# Patient Record
Sex: Male | Born: 1955
Health system: Southern US, Community
[De-identification: ages and names within clinical notes are randomized; demographics above are authoritative.]

## PROBLEM LIST (undated history)

## (undated) DIAGNOSIS — I639 Cerebral infarction, unspecified: Secondary | ICD-10-CM

## (undated) DIAGNOSIS — F32A Depression, unspecified: Secondary | ICD-10-CM

## (undated) DIAGNOSIS — M1990 Unspecified osteoarthritis, unspecified site: Secondary | ICD-10-CM

## (undated) DIAGNOSIS — R911 Solitary pulmonary nodule: Secondary | ICD-10-CM

## (undated) DIAGNOSIS — Z66 Do not resuscitate: Secondary | ICD-10-CM

## (undated) DIAGNOSIS — M4802 Spinal stenosis, cervical region: Secondary | ICD-10-CM

## (undated) DIAGNOSIS — E785 Hyperlipidemia, unspecified: Secondary | ICD-10-CM

## (undated) DIAGNOSIS — C801 Malignant (primary) neoplasm, unspecified: Secondary | ICD-10-CM

## (undated) DIAGNOSIS — E871 Hypo-osmolality and hyponatremia: Secondary | ICD-10-CM

## (undated) DIAGNOSIS — R001 Bradycardia, unspecified: Secondary | ICD-10-CM

## (undated) DIAGNOSIS — D696 Thrombocytopenia, unspecified: Secondary | ICD-10-CM

## (undated) DIAGNOSIS — Z720 Tobacco use: Secondary | ICD-10-CM

## (undated) DIAGNOSIS — G459 Transient cerebral ischemic attack, unspecified: Secondary | ICD-10-CM

## (undated) DIAGNOSIS — I10 Essential (primary) hypertension: Secondary | ICD-10-CM

## (undated) DIAGNOSIS — F419 Anxiety disorder, unspecified: Secondary | ICD-10-CM

## (undated) DIAGNOSIS — K219 Gastro-esophageal reflux disease without esophagitis: Secondary | ICD-10-CM

## (undated) HISTORY — PX: VARICOSE VEIN SURGERY: SHX832

## (undated) HISTORY — DX: Malignant (primary) neoplasm, unspecified: C80.1

## (undated) HISTORY — DX: Bradycardia, unspecified: R00.1

## (undated) HISTORY — PX: HERNIA REPAIR: SHX51

## (undated) HISTORY — DX: Cerebral infarction, unspecified: I63.9

## (undated) HISTORY — DX: Tobacco use: Z72.0

## (undated) HISTORY — DX: Transient cerebral ischemic attack, unspecified: G45.9

## (undated) HISTORY — DX: Essential (primary) hypertension: I10

## (undated) HISTORY — PX: FINGER SURGERY: SHX640

## (undated) HISTORY — DX: Hyperlipidemia, unspecified: E78.5

---

## 2014-07-13 ENCOUNTER — Emergency Department: Payer: Self-pay | Admitting: Emergency Medicine

## 2014-07-13 LAB — URINALYSIS, COMPLETE
BILIRUBIN, UR: NEGATIVE
Bacteria: NONE SEEN
Blood: NEGATIVE
Glucose,UR: NEGATIVE mg/dL (ref 0–75)
KETONE: NEGATIVE
LEUKOCYTE ESTERASE: NEGATIVE
NITRITE: NEGATIVE
Ph: 8 (ref 4.5–8.0)
Protein: NEGATIVE
SQUAMOUS EPITHELIAL: NONE SEEN
Specific Gravity: 1.019 (ref 1.003–1.030)
WBC UR: NONE SEEN /HPF (ref 0–5)

## 2014-07-13 LAB — COMPREHENSIVE METABOLIC PANEL
ALBUMIN: 3.5 g/dL (ref 3.4–5.0)
ALK PHOS: 70 U/L
Anion Gap: 8 (ref 7–16)
BUN: 17 mg/dL (ref 7–18)
Bilirubin,Total: 0.2 mg/dL (ref 0.2–1.0)
CALCIUM: 8.5 mg/dL (ref 8.5–10.1)
CHLORIDE: 112 mmol/L — AB (ref 98–107)
CO2: 26 mmol/L (ref 21–32)
CREATININE: 1.12 mg/dL (ref 0.60–1.30)
EGFR (African American): >60
Glucose: 95 mg/dL (ref 65–99)
OSMOLALITY: 292 (ref 275–301)
Potassium: 4 mmol/L (ref 3.5–5.1)
SGOT(AST): 26 U/L (ref 15–37)
SGPT (ALT): 20 U/L
Sodium: 146 mmol/L — ABNORMAL HIGH (ref 136–145)
Total Protein: 7.1 g/dL (ref 6.4–8.2)

## 2014-07-13 LAB — CBC
HCT: 49.2 % (ref 40.0–52.0)
HGB: 16.6 g/dL (ref 13.0–18.0)
MCH: 30.8 pg (ref 26.0–34.0)
MCHC: 33.7 g/dL (ref 32.0–36.0)
MCV: 91 fL (ref 80–100)
Platelet: 110 10*3/uL — ABNORMAL LOW (ref 150–440)
RBC: 5.39 10*6/uL (ref 4.40–5.90)
RDW: 13.7 % (ref 11.5–14.5)
WBC: 7.4 10*3/uL (ref 3.8–10.6)

## 2014-07-13 LAB — LIPASE, BLOOD: LIPASE: 100 U/L (ref 73–393)

## 2014-07-13 LAB — TROPONIN I

## 2014-09-12 ENCOUNTER — Emergency Department: Payer: Self-pay | Admitting: Emergency Medicine

## 2014-09-12 LAB — CBC
HCT: 46.4 % (ref 40.0–52.0)
HGB: 15.4 g/dL (ref 13.0–18.0)
MCH: 30.6 pg (ref 26.0–34.0)
MCHC: 33.3 g/dL (ref 32.0–36.0)
MCV: 92 fL (ref 80–100)
Platelet: 115 10*3/uL — ABNORMAL LOW (ref 150–440)
RBC: 5.05 10*6/uL (ref 4.40–5.90)
RDW: 13.8 % (ref 11.5–14.5)
WBC: 7.1 10*3/uL (ref 3.8–10.6)

## 2014-09-12 LAB — COMPREHENSIVE METABOLIC PANEL
ALBUMIN: 3.4 g/dL (ref 3.4–5.0)
ALT: 18 U/L
ANION GAP: 7 (ref 7–16)
AST: 17 U/L (ref 15–37)
Alkaline Phosphatase: 85 U/L
BUN: 21 mg/dL — AB (ref 7–18)
Bilirubin,Total: 0.2 mg/dL (ref 0.2–1.0)
CALCIUM: 8.2 mg/dL — AB (ref 8.5–10.1)
Chloride: 114 mmol/L — ABNORMAL HIGH (ref 98–107)
Co2: 28 mmol/L (ref 21–32)
Creatinine: 0.87 mg/dL (ref 0.60–1.30)
EGFR (African American): >60
Glucose: 83 mg/dL (ref 65–99)
OSMOLALITY: 298 (ref 275–301)
POTASSIUM: 3.9 mmol/L (ref 3.5–5.1)
Sodium: 149 mmol/L — ABNORMAL HIGH (ref 136–145)
Total Protein: 6.7 g/dL (ref 6.4–8.2)

## 2014-09-12 LAB — LIPASE, BLOOD: LIPASE: 112 U/L (ref 73–393)

## 2014-09-12 LAB — URINALYSIS, COMPLETE
Bacteria: NONE SEEN
Bilirubin,UR: NEGATIVE
Blood: NEGATIVE
GLUCOSE, UR: NEGATIVE mg/dL (ref 0–75)
Ketone: NEGATIVE
LEUKOCYTE ESTERASE: NEGATIVE
NITRITE: NEGATIVE
PH: 5 (ref 4.5–8.0)
PROTEIN: NEGATIVE
RBC,UR: 1 /HPF (ref 0–5)
Specific Gravity: 1.028 (ref 1.003–1.030)
WBC UR: 1 /HPF (ref 0–5)

## 2015-02-15 ENCOUNTER — Inpatient Hospital Stay: Payer: Self-pay | Admitting: Internal Medicine

## 2015-02-15 DIAGNOSIS — Z720 Tobacco use: Secondary | ICD-10-CM

## 2015-02-15 DIAGNOSIS — I639 Cerebral infarction, unspecified: Secondary | ICD-10-CM

## 2015-02-15 DIAGNOSIS — I495 Sick sinus syndrome: Secondary | ICD-10-CM

## 2015-02-15 DIAGNOSIS — G459 Transient cerebral ischemic attack, unspecified: Secondary | ICD-10-CM

## 2015-02-15 DIAGNOSIS — I10 Essential (primary) hypertension: Secondary | ICD-10-CM

## 2015-02-17 DIAGNOSIS — R001 Bradycardia, unspecified: Secondary | ICD-10-CM

## 2015-02-22 ENCOUNTER — Telehealth: Payer: Self-pay

## 2015-02-22 NOTE — Telephone Encounter (Signed)
Attempted to contact pt regarding discharge from [facility] on 02/16/15. Left message asking pt to call back w/ any questions or concerns regarding her discharge instructions and/or medications.  Advised her of appt w/ Dr. Rockey Situ 03/05/15 @ 9:45. Asked her to call back if she is unable to keep this appt.

## 2015-03-05 ENCOUNTER — Encounter: Payer: Self-pay | Admitting: Cardiovascular Disease

## 2015-03-05 ENCOUNTER — Ambulatory Visit (INDEPENDENT_AMBULATORY_CARE_PROVIDER_SITE_OTHER): Payer: Self-pay | Admitting: Cardiovascular Disease

## 2015-03-05 VITALS — BP 142/88 | HR 48 | Ht 72.0 in | Wt 209.5 lb

## 2015-03-05 DIAGNOSIS — G458 Other transient cerebral ischemic attacks and related syndromes: Secondary | ICD-10-CM | POA: Insufficient documentation

## 2015-03-05 DIAGNOSIS — R001 Bradycardia, unspecified: Secondary | ICD-10-CM

## 2015-03-05 DIAGNOSIS — R531 Weakness: Secondary | ICD-10-CM | POA: Insufficient documentation

## 2015-03-05 DIAGNOSIS — R5383 Other fatigue: Secondary | ICD-10-CM

## 2015-03-05 DIAGNOSIS — Z638 Other specified problems related to primary support group: Secondary | ICD-10-CM

## 2015-03-05 DIAGNOSIS — F439 Reaction to severe stress, unspecified: Secondary | ICD-10-CM

## 2015-03-05 NOTE — Assessment & Plan Note (Signed)
TIA-type symptoms. Other atypical symptoms since his discharge. Recommended he continue on aspirin daily Unclear if he needs to stay on the statin given minimal carotid disease, cholesterol 170

## 2015-03-05 NOTE — Patient Instructions (Signed)
You are doing well. No medication changes were made.  We will monitor your monitor and will call you for abnormal rhythm  Please call us if you have new issues that need to be addressed before your next appt.

## 2015-03-05 NOTE — Assessment & Plan Note (Signed)
Stress as mentioned above, taking care of elderly parent. Currently unemployed

## 2015-03-05 NOTE — Assessment & Plan Note (Signed)
Fatigue likely secondary to taking care of his mother at home who has dementia. Broken sleep

## 2015-03-05 NOTE — Progress Notes (Signed)
Patient ID: Alejandro Wall, male    DOB: 03/03/1956, 59 y.o.   MRN: [id number]  HPI Comments: Mr. Alejandro Wall is a 59 year old gentleman with history of chronic sinus bradycardia, recently presenting to the hospital with TIA type symptoms, right-sided weakness, slurred speech. Date of admission 02/15/2015. He was seen by cardiology for bradycardia. This was felt to be a chronic issue, unrelated to his presentation. He is a smoker, smoked from age [age] years He reports having progressive shortness of breath over the past 2 years with exertion  He had MRI of the brain, echocardiogram, carotid duplex all of which were normal He was started on aspirin, statin and discharged home. He was recommended that he have a 30 day event monitor to rule out high-grade heart block  In follow-up today, he reports that he has been doing well. He takes care of his elderly mother. He is looking for a job, having difficulty managing her at home. He has broken sleep, feels tired most of the time. He does report having 2-3 seconds of right leg weakness since his discharge. This resolved without intervention  Lab work in the hospital showed total cholesterol 176, LDL 116, HDL 37  Monitor was reviewed with him which shows normal sinus rhythm with heart rate ranging from 50-60, rarely down to 40.  EKG on today's visit shows sinus bradycardia with rate 46 bpm, nonspecific T wave abnormality   No Known Allergies  Outpatient Encounter Prescriptions as of 03/05/2015  Medication Sig  . aspirin 81 MG tablet Take 81 mg by mouth daily.  . ciprofloxacin (CIPRO) 500 MG tablet Take 500 mg by mouth 2 (two) times daily.  . metroNIDAZOLE (FLAGYL) 500 MG tablet Take 500 mg by mouth every 12 (twelve) hours.  . ondansetron (ZOFRAN) 4 MG tablet Take 4 mg by mouth 3 (three) times daily as needed for nausea or vomiting.  . simvastatin (ZOCOR) 40 MG tablet Take 40 mg by mouth daily.    Past Medical History  Diagnosis Date  .  Hyperlipidemia   . TIA (transient ischemic attack)   . Stroke   . Hypertension   . Tobacco abuse   . Bradycardia     Past Surgical History  Procedure Laterality Date  . Hernia repair      x 2  . Finger surgery    . Varicose vein surgery      Social History  reports that he has been smoking Cigarettes.  He has a 20 pack-year smoking history. He does not have any smokeless tobacco history on file. He reports that he does not drink alcohol or use illicit drugs.  Family History family history includes Heart failure in his mother.   Review of Systems  Constitutional: Positive for fatigue.  Respiratory: Negative.   Cardiovascular: Negative.   Gastrointestinal: Negative.   Musculoskeletal: Negative.   Skin: Negative.   Neurological: Negative.        Leg weakness, resolved  Hematological: Negative.   Psychiatric/Behavioral: Negative.   All other systems reviewed and are negative.   BP 142/88 mmHg  Pulse 48  Ht 6' (1.829 m)  Wt 209 lb 8 oz (95.029 kg)  BMI 28.41 kg/m2  Physical Exam  Constitutional: He is oriented to person, place, and time. He appears well-developed and well-nourished.  HENT:  Head: Normocephalic.  Nose: Nose normal.  Mouth/Throat: Oropharynx is clear and moist.  Eyes: Conjunctivae are normal. Pupils are equal, round, and reactive to light.  Neck: Normal range of motion. Neck supple.  No JVD present.  Cardiovascular: Normal rate, regular rhythm, S1 normal, S2 normal, normal heart sounds and intact distal pulses.  Exam reveals no gallop and no friction rub.   No murmur heard. Pulmonary/Chest: Effort normal and breath sounds normal. No respiratory distress. He has no wheezes. He has no rales. He exhibits no tenderness.  Abdominal: Soft. Bowel sounds are normal. He exhibits no distension. There is no tenderness.  Musculoskeletal: Normal range of motion. He exhibits no edema or tenderness.  Lymphadenopathy:    He has no cervical adenopathy.  Neurological:  He is alert and oriented to person, place, and time. Coordination normal.  Skin: Skin is warm and dry. No rash noted. No erythema.  Psychiatric: He has a normal mood and affect. His behavior is normal. Judgment and thought content normal.      Assessment and Plan   Nursing note and vitals reviewed.

## 2015-03-05 NOTE — Assessment & Plan Note (Signed)
Asymptomatic bradycardia. No further workup at this time. He currently has a 30 day monitor in place. No other pauses, high degree block noted

## 2015-03-25 DIAGNOSIS — M1990 Unspecified osteoarthritis, unspecified site: Secondary | ICD-10-CM | POA: Insufficient documentation

## 2015-03-25 LAB — POCT ERYTHROCYTE SEDIMENTATION RATE, NON-AUTOMATED: Sed Rate: 4 mm

## 2015-04-04 NOTE — Discharge Summary (Signed)
PATIENT NAME:  Alejandro Wall, Alejandro Wall MR#:  [id number] DATE OF BIRTH:  04-14-1956  DATE OF ADMISSION:  02/15/2015 DATE OF DISCHARGE:  02/16/2015  PRIMARY CARE PHYSICIAN:  None local.  DISCHARGE DIAGNOSES: 1. Transient ischemic attack.  2. Sinus bradycardia. 3. Hypertension.  4. Hyperlipidemia.  5. Tobacco abuse.   CONDITION: Stable.   CODE STATUS: Full code.   HOME MEDICATIONS: Aspirin 81 mg p.o. daily, Zocor 40 mg p.o. at bedtime.   DIET: Low sodium, low fat, low cholesterol diet.   ACTIVITY: As tolerated.   FOLLOW-UP CARE:  With PCP and Dr. Rockey Situ and within 1-2 weeks.   REASON FOR ADMISSION: Weakness on the right side and slurred speech.   HOSPITAL COURSE: The patient is a 59 year old Caucasian male with no past medical history, came to ED due to right side weakness and slurred speech. For detailed history and physical examinations, please refer to the admission note dictated by Dr. Volanda Napoleon.  The patient's CT scan of head did not show any intracranial abnormalities. Chest x-ray did not show any acute abnormality. EKG shows sinus bradycardia at 40. The patient's labs were unremarkable.   1. Transient ischemic attack. The patient's symptoms resolved after admission.  The patient got an MRI of brain which was negative for CVA. Echocardiograph was normal.  A carotid duplex did not show any stenosis.  The patient has been treated with aspirin and statin. The patient has no complaints.  2. Sinus bradycardia with Dr. Rockey Situ suggested the patient has sinus bradycardia, etiology is not clear but I did not see any evidence of high-grade heart block. The patient needs to follow up with him as outpatient for 30 day event monitor.    The patient has no complaints. Vital signs are stable. Physical  examination is unremarkable. He is clinically stable and will be discharged to home today. I discussed the patient's discharge plan with the patient, nurse, case manager, and Dr. Rockey Situ.  In addition, the patient  was counseled for smoking cessation.   TIME SPENT:  About 43 minutes.    ____________________________ Demetrios Loll, MD qc:tr D: 02/16/2015 16:50:59 ET T: 02/16/2015 18:00:05 ET JOB#: [id number]  cc: Demetrios Loll, MD, <Dictator> Demetrios Loll MD ELECTRONICALLY SIGNED 02/17/2015 17:17

## 2015-04-04 NOTE — H&P (Signed)
PATIENT NAME:  Alejandro Wall, Alejandro Wall MR#:  [id number] DATE OF BIRTH:  12/31/1955  DATE OF ADMISSION:  02/15/2015   PRIMARY CARE PHYSICIAN: None.   REFERRING EMERGENCY ROOM PHYSICIAN: Lavonia Drafts, MD   CHIEF COMPLAINT: Weakness on the right side and slurred speech.   HISTORY OF PRESENT ILLNESS: This very pleasant 59 year old man with no significant past medical history presents to the Emergency Room with approximately 24 hours of intermittant weakness on the right side and slurred speech. He reports that he was in his normal state of health until yesterday at about 1:30 p.m. when he was walking around his backyard.  He began to feel very dizzy and weak on the right side, lost his balance and had to sit down.  Symptoms improved but recurred at about 3:00 p.m. and then improved again.  He then went to bed as usual in the evening.  At 1:00 a.m. he woke up to urinate and found that he was unable to walk to the bathroom due to weakness. He went back to sleep and at 7:00 a.m. he awoke with right-sided weakness, and slurred speech.  On presentation to the Emergency Room, his weakness has improved and his speech is clear. His heart rate is noted to be in the 30s to 40s.  Hospitalist service is asked to admit for further evaluation and treatment of potential CVA and bradycardia.   PAST MEDICAL HISTORY: No significant past medical history.   PAST SURGICAL HISTORY:  Hernia repair.   ALLERGIES: No known allergies.   HOME MEDICATIONS:  No home medications.   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  He is a current 1/2 pack per day smoker.  He does not drink alcohol or use any illicit substances. He is currently unemployed.   FAMILY MEDICAL HISTORY: Positive for coronary artery disease in both parents, stroke in his mother, diabetes in his mother. No family history of cancers.   REVIEW OF SYSTEMS:  CONSTITUTIONAL: No fever, pain, or change in weight.  HEENT: No change in  vision or hearing. No pain in eyes or ears. No sore throat, sinus congestion or difficulty swallowing.  RESPIRATORY: No cough, wheezing, shortness of breath or painful respirations. No history of COPD.   CARDIOVASCULAR: No chest pain, orthopnea, edema, no palpitations, no syncope or presyncope.  GASTROINTESTINAL: No nausea, vomiting, diarrhea, abdominal pain, or change in bowel habits.  GENITOURINARY: No dysuria or frequency.  SKIN: No new rashes or lesions.  HEMATOLOGIC: No easy bruising or bleeding. No swollen glands.  MUSCULOSKELETAL: Positive as above for right-sided weakness.  No new pain in the neck, back, shoulders, knees, or hips. No arthritis or gout.  NEUROLOGIC: Positive as above for right-sided weakness, slurred speech. No prior history of CVA, seizure, memory loss, or confusion, no headache.  PSYCHIATRIC: No bipolar disorder or schizophrenia.   PHYSICAL EXAMINATION:  VITAL SIGNS: Temperature 98.4, pulse 44, respirations 16, blood pressure 132/102, oxygenation 98% on room air.  GENERAL: No acute distress. The patient does appear fatigued.  HEENT: Pupils equal, round, and reactive to light. Extraocular motion intact. Conjunctivae are clear. Oral mucous membranes are pink and moist. Good dentition.  Posterior oropharynx is clear of exudate, edema, or erythema. Trachea is midline. No cervical lymphadenopathy. Thyroid is nontender.  RESPIRATORY: Lungs clear to auscultation bilaterally with good air movement. No wheezes, rhonchi.  CARDIOVASCULAR: Bradycardic, regular. No murmurs, rubs, or gallops. No carotid bruit. No peripheral edema. Peripheral pulses are 2+.  ABDOMEN: Soft, nontender,  nondistended. No guarding, no rebound, no hepatosplenomegaly or mass noted.  SKIN: No unusual rashes or skin lesions. No bruises.  MUSCULOSKELETAL: No swollen tender joints. Range of motion is normal and strength is 5/5 throughout.  NEUROLOGIC: Cranial nerves II through XII are grossly intact. Strength and  sensation are intact with the exception of his right hip flexors, which are 4+/5, otherwise all muscle groups with normal strength.  PSYCHIATRIC: The patient alert and oriented x 4 with good insight into his clinical condition. No signs of uncontrolled depression or anxiety.   LABORATORY DATA: Sodium 140, potassium 3.9, chloride 109, bicarbonate 23, BUN 17, creatinine 0.82, glucose 107, calcium 8.8, total protein 6.9, albumin 4.2, bilirubin 0.6.  LFTs normal. White blood cells 7.4, hemoglobin 16.5, platelets 113,000, MCV is 91.  INR is 1.0. Urinalysis is negative for signs of infection.   IMAGING:   1.  CT scan of the head without contrast is negative for acute intracranial abnormality.  2.  Chest x-ray shows minimal enlargement of the cardiac silhouette, no acute abnormalities.  3.  EKG shows sinus bradycardia.     ASSESSMENT AND PLAN:   1.  Transient ischemic attack:  Symptoms of right-sided weakness have mostly resolved with the exception of weakness of the right hip flexors.  Speech is clear.  The rest of the neurologic exam is normal.  CT is negative. MRI is pending. We will consult physical therapy and speech therapy.  He has been given an aspirin in the Emergency Room. He will be started on a statin.  I have ordered a 2-D echocardiogram, carotid Dopplers, telemetry and have consulted neurology.  2.  Bradycardia. The patient reports no history of bradycardia:  We will check cardiac enzymes, cycling 3 times.  We will admit to the stepdown unit.  He is not on any antiarrhythmic medications.  We will check a TSH.  Dr. Corky Downs has spoken with Dr. Fletcher Anon and the patient has been seen by Pilar Jarvis, NP.  At this point the plan is to observe him in stepdown unit as he is hemodynamically stable.  3.  Ongoing tobacco abuse:  The patient counseled on smoking cessation for 5 minutes by me.  We will provide nicotine replacement if needed.  4.  Prophylaxis: Heparin for deep vein thrombosis prophylaxis.  No gastrointestinal prophylaxis at this time.   TIME SPENT ON ADMISSION:  45 minutes.     ___________________________ Earleen Newport. Volanda Napoleon, MD cpw:DT D: 02/15/2015 12:50:23 ET T: 02/15/2015 13:38:18 ET JOB#: [id number]  cc: Earleen Newport. Volanda Napoleon, MD, <Dictator> Aldean Jewett MD ELECTRONICALLY SIGNED 02/16/2015 15:02

## 2015-04-04 NOTE — Consult Note (Signed)
General Aspect PCP:  None Primary Cardiologist: New _____________  Pt Profile 59 y/o male w/o prior cardiac hx who presented to the ED today 2/2 right sided wkns, slurred speech, and unsteady gait, and has been found to be bradycardic. _____________   Past Medical Hx 1.  HTN      a. Previously on meds but taken off @ some point. 2.  IBS 3.  Tobacco Abuse      a. 1/4-1/3 ppd x 19 yrs (ongoing) 4.  Umbilical Hernia s/p repair @ age 67. 63.  Left Inguinal hernia s/p repair  2000. _____________   Present Illness 59 y/o male w/o prior cardiac history.  He lives locally with his mother.  He does not have a PCP, is on no meds, and has been smoking ~ 1/4 - 1/3 ppd of cigarettes since the age of 46.  He is unemployed and mostly takes care of his mother, who is chronically ill and in and out of the hospital.    He was in his usoh until yesterday  when he began to experience unsteady gait followed by slurring of his speech.  Though the wkns and gait disturbance persisted throughout the day, the slurring of speech eventually resolved.  This AM @ 1AM, he awoke to use the bathroom but couldn't get out of bed b/c his legs were too weak.  He eventually found the strength to get out of bed but was again very unsteady and he had to hold onto the wall to support himself and prevent himself from falling.  He ended up falling back to sleep but when he awoke later in the morning, he continued to note R>L sided wkns, unsteady gait, and return of slurred speech.  He called a neighbor who then drove him into the [facility] ED.  Here, labs are unrevealing, Head CT neg for infarct, and CXR non-acute.  He has been persistently bradycardic with rates in the high 30s to 50s. There is no evidence of high grade heart block.  He is unaware of any prior h/o bradycardia.  Despite bradycardia he is hypertensive.  He denies chest pain, sob, pnd, orthopnea, nausea, vomiting, dizziness, syncope, edema, or early  satiety.  _____________  Family Hx Mother is alive @ 40.  She has a h/o CAD/CHF. Father died @ 64 with COPD/Black Lung. _____________  Social Hx Pt lives with his mother in [place]. He previously worked for [employer] but is currently unemployed.  He smokes 1/4 - 1/3 ppd of cigarettes.  He does not use drugs or alcohol.  He does not routinely exercise.  Meds: currently not on any outpt medications   Physical Exam:  GEN well developed, well nourished, no acute distress   HEENT hearing intact to voice, moist oral mucosa   NECK supple  no bruits, jvd.   RESP normal resp effort  clear BS   CARD Regular rate and rhythm  Bradycardic  Normal, S1, S2  No murmur   ABD denies tenderness  soft  normal BS   LYMPH negative neck   EXTR negative cyanosis/clubbing, negative edema   SKIN normal to palpation   NEURO cranial nerves intact, R facial droop.  Strength 2/5 RUE/RLE, 4/5 LUE, 3/5 LLE.   PSYCH alert, A+O to time, place, person, poor insight   Review of Systems:  Subjective/Chief Complaint right arm weakness   General: Fatigue  Weakness   Skin: No Complaints   ENT: No Complaints   Eyes: No Complaints   Neck:  No Complaints   Respiratory: No Complaints   Cardiovascular: No Complaints   Gastrointestinal: No Complaints   Genitourinary: No Complaints   Vascular: No Complaints   Musculoskeletal: No Complaints   Neurologic: right sided wkns, facial droop, slurred speech, unsteady gait.   Hematologic: No Complaints   Endocrine: No Complaints   Psychiatric: No Complaints   Review of Systems: All other systems were reviewed and found to be negative   Medications/Allergies Reviewed Medications/Allergies reviewed   Lab Results:  Hepatic:  14-Mar-16 10:08   Bilirubin, Total 0.6 (0.3-1.2 NOTE: New Reference Range  01/26/15)  Alkaline Phosphatase 57 (38-126 NOTE: New Reference Range  01/26/15)  SGPT (ALT) 19 (17-63 NOTE: New Reference Range   01/26/15)  SGOT (AST) 21 (15-41 NOTE: New Reference Range  01/26/15)  Total Protein, Serum 6.9 (6.5-8.1 NOTE: New Reference Range  01/26/15)  Albumin, Serum 4.2 (3.5-5.0 NOTE: New reference range  01/26/15)  Routine Chem:  14-Mar-16 10:08   Glucose, Serum  107 (65-99 NOTE: New Reference Range  01/26/15)  BUN 17 (6-20 NOTE: New Reference Range  01/26/15)  Creatinine (comp) 0.82 (0.61-1.24 NOTE: New Reference Range  01/26/15)  Sodium, Serum 140 (135-145 NOTE: New Reference Range  01/26/15)  Potassium, Serum 3.9 (3.5-5.1 NOTE: New Reference Range  01/26/15)  Chloride, Serum 109 (101-111 NOTE: New Reference Range  01/26/15)  CO2, Serum 23 (22-32 NOTE: New Reference Range  01/26/15)  Calcium (Total), Serum  8.8 (8.9-10.3 NOTE: New Reference Range  01/26/15)  eGFR (African American) >60  eGFR (Non-African American) >60 (eGFR values <99m/min/1.73 m2 may be an indication of chronic kidney disease (CKD). Calculated eGFR is useful in patients with stable renal function. The eGFR calculation will not be reliable in acutely ill patients when serum creatinine is changing rapidly. It is not useful in patients on dialysis. The eGFR calculation may not be applicable to patients at the low and high extremes of body sizes, pregnant women, and vegetarians.)  Anion Gap 8  Routine UA:  14-Mar-16 10:08   Color (UA) Yellow  Clarity (UA) Clear  Glucose (UA) Negative  Bilirubin (UA) Negative  Ketones (UA) Negative  Specific Gravity (UA) 1.019  Blood (UA) Negative  pH (UA) 6.0  Protein (UA) Negative  Nitrite (UA) Negative  Leukocyte Esterase (UA) Negative (Result(s) reported on 15 Feb 2015 at 12:03PM.)  RBC (UA) 1 /HPF  WBC (UA) <1 /HPF  Bacteria (UA) NONE SEEN  Epithelial Cells (UA) NONE SEEN  Result(s) reported on 15 Feb 2015 at 12:03PM.  Routine Coag:  14-Mar-16 10:08   Activated PTT (APTT) 26.3 (A HCT value >55% may artifactually increase the APTT. In one study, the  increase was an average of 19%. Reference: "Effect on Routine and Special Coagulation Testing Values of Citrate Anticoagulant Adjustment in Patients with High HCT Values." American Journal of Clinical Pathology 27622;633:354-562)  Prothrombin 13.7 (11.4-15.0 NOTE: New Reference Range  01/01/15)  INR 1.0 (INR reference interval applies to patients on anticoagulant therapy. A single INR therapeutic range for coumarins is not optimal for all indications; however, the suggested range for most indications is 2.0 - 3.0. Exceptions to the INR Reference Range may include: Prosthetic heart valves, acute myocardial infarction, prevention of myocardial infarction, and combinations of aspirin and anticoagulant. The need for a higher or lower target INR must be assessed individually. Reference: The Pharmacology and Management of the Vitamin K  antagonists: the seventh ACCP Conference on Antithrombotic and Thrombolytic Therapy. CBWLSL.3734Sept:126 (3suppl): [id number] A HCT value >55% may artifactually  increase the PT.  In one study,  the increase was an average of 25%. Reference:  "Effect on Routine and Special Coagulation Testing Values of Citrate Anticoagulant Adjustment in Patients with High HCT Values." American Journal of Clinical Pathology 2006;126:400-405.)  Routine Hem:  14-Mar-16 10:08   WBC (CBC) 7.4  RBC (CBC) 5.34  Hemoglobin (CBC) 16.5  Hematocrit (CBC) 48.5  Platelet Count (CBC)  113 (Result(s) reported on 15 Feb 2015 at 10:50AM.)  MCV 91  MCH 30.9  MCHC 34.1  RDW 13.7   EKG:  EKG Interp. by me   Interpretation EKG shows Sinus bradycardia, 38, non-specific t changes. No clear evidence of high grade heart block.   Additional Comments ED tele reviewed - no high grade heart block/pauses.   Radiology Results:  XRay:    14-Mar-16 10:57, Chest Portable Single View  Chest Portable Single View   REASON FOR EXAM:    CVA  COMMENTS:       PROCEDURE: DXR - DXR PORTABLE CHEST  SINGLE VIEW  - Feb 15 2015 10:57AM     CLINICAL DATA:  Began having difficulty speaking and collecting  thoughts yesterday, went away then came back this morning, history  smoking, difficulty taking deep breath, weakness, slurred speech    EXAM:  PORTABLE CHEST - 1 VIEW    COMPARISON:  Portable exam 1038 hours without priors for comparison.    FINDINGS:  Minimal enlargement of cardiac silhouette.    Mediastinal contours and pulmonary vascularity normal.    Lungs grossly clear.    No pleural effusion, pneumothorax or acute osseous findings.     IMPRESSION:  Minimal enlargement of cardiac silhouette.    No acute abnormalities.      Electronically Signed    By: Lavonia Dana M.D.    On: 02/15/2015 10:59         Verified By: Burnetta Sabin, M.D.,  [place]:    14-Mar-16 17:25, US Carotid Doppler Bilateral  US Carotid Doppler Bilateral   REASON FOR EXAM:    CVA  COMMENTS:       PROCEDURE: [place]  - US CAROTID DOPPLER BILATERAL  - Feb 15 2015  5:25PM     CLINICAL DATA:  CVA.  History of smoking.    EXAM:  BILATERAL CAROTID DUPLEX ULTRASOUND    TECHNIQUE:  Pearline Cables scale imaging, color Dopplerand duplex ultrasound were  performed of bilateral carotid and vertebral arteries in the neck.    COMPARISON:  None.  FINDINGS:  Criteria: Quantification of carotid stenosis is based on velocity  parameters that correlate the residual internal carotid diameter  with NASCET-based stenosis levels, using the diameter of the distal  internal carotid lumen as the denominator for stenosis measurement.    The following velocity measurements were obtained:    RIGHT    ICA:  71/21 cm/sec    CCA:  57/26 cm/sec    SYSTOLIC ICA/CCA RATIO:  0.8  DIASTOLIC ICA/CCA RATIO:  1.4    ECA:  57 cm/sec    LEFT    ICA:  76/28 81/13 cm/sec    CCA:  20/35 cm/sec    SYSTOLIC ICA/CCA RATIO:  0.9    DIASTOLIC ICA/CCA RATIO:  2.1    ECA:  73 cm/sec  RIGHT CAROTID ARTERY:There is no grayscale evidence  of significant  intimal thickening or atherosclerotic plaque affecting the  interrogated portions of the right carotid system. There are no  elevated peak systolic velocities within the interrogated course of  the rightinternal  carotid artery to suggest a hemodynamically  significant stenosis.    RIGHT VERTEBRAL ARTERY:  Antegrade flow    LEFT CAROTID ARTERY: There is a very minimal amount of intimal wall  thickening within the left carotid bulb (images 46 and 48),  extending to involve the origin and proximal aspect of the left  internal carotid artery (image 56), not resulting in elevated peak  systolic velocities within the interrogated course the left internal  carotid artery to suggest a hemodynamically significant stenosis.  LEFT VERTEBRAL ARTERY:  Antegrade flow     IMPRESSION:  1. Very minimal amount of left-sided intimal wall thickening and  atherosclerotic plaque, not resulting in a hemodynamically  significant stenosis.  2. Normal sonographic evaluation of the right carotid system.      Electronically Signed    By: Sandi Mariscal M.D.    On: 02/15/2015 17:30         Verified By: Aileen Fass, M.D.,  MRI:    14-Mar-16 16:14, MRI Brain Without Contrast  MRI Brain Without Contrast   REASON FOR EXAM:    CVA  COMMENTS:       PROCEDURE: MR  - MR BRAIN WO CONTRAST  - Feb 15 2015  4:14PM     CLINICAL DATA:  Right-sided weakness with slurred speech today.  Stroke    EXAM:  MRI HEAD WITHOUT CONTRAST    TECHNIQUE:  Multiplanar, multiecho pulse sequences of the brain and surrounding  structures were obtained without intravenous contrast.  COMPARISON:  CT head 02/15/2015    FINDINGS:  Ventricle size is normal. Cerebral volume is normal. Pituitary  normal in size. Craniocervical junction normal.    Negative for acute infarct.    Mild hyperintensity in the pons consistent with mild chronic  microvascular ischemia. Small hyperintensity right frontal white  matter.  Remaining cerebral white matter is normal. No cortical  infarct    Negative for hemorrhage  Negative for mass or edema.    Minimal mucosal edema in the paranasal sinuses.     IMPRESSION:  Mild chronic microvascular ischemic change.  No acute abnormality.      Electronically Signed    By: Franchot Gallo M.D.    On: 02/15/2015 16:29         Verified By: Truett Perna, M.D.,  CT:    14-Mar-16 11:00, CT Head Without Contrast  CT Head Without Contrast   REASON FOR EXAM:    CVA  COMMENTS:   May transport without cardiac monitor    PROCEDURE: CT  - CT HEAD WITHOUT CONTRAST  - Feb 15 2015 11:00AM     CLINICAL DATA:  Stroke. Right-sided weakness and slurred speech  since yesterday. Right-sided facial droop.    EXAM:  CT HEAD WITHOUT CONTRAST    TECHNIQUE:  Contiguous axial images were obtained from the base of the skull  through the vertex without intravenous contrast.  COMPARISON:  None.    FINDINGS:  The ventricles and sulci are within normal limits for age. There is  no evidence of acute infarct, intracranial hemorrhage, mass, midline  shift, or extra-axial collection. The orbits are unremarkable. The  visualized paranasal sinuses and mastoid air cells are clear. There  is no evidence of acute fracture.     IMPRESSION:  Negative.      Electronically Signed    By: Logan Bores    On: 02/15/2015 11:12         Verified By: Ferol Luz, M.D.,  No Known Allergies:   Vital Signs/Nurse's Notes:  **Vital Signs.:   14-Mar-16 17:17  Vital Signs Type returned to icu from mri and [place]  Temperature Temperature (F) 98.3  Celsius 36.8  Temperature Source oral  Pulse Pulse 38  Respirations Respirations 16  Systolic BP Systolic BP 757  Diastolic BP (mmHg) Diastolic BP (mmHg) 97  Mean BP 110  Oxygen Delivery Room Air/ 21 %    Impression 1.  Acute CVA Pt presented to the [facility] ED with a 1 day h/o unsteady gait, R>L sided wkns, and intermittent slurring of speech.    Head CT neg for infarct.   MRI with no infarct, possible TIA --Stroke w/u per IM.  2.  Sinus Bradycardia In setting of #1, pt is bradycardic but hemodynamically stable. ECG and tele extensively reviewed.  No clear evidence of high grade heart block noted. No reversible causes noted in labs. Not on any meds @ home. --In the setting of baseline HTN despite bradycardia, I do not think that bradycardia is contributing to his symptoms. --No indication for pacing @ this time. --Check echo. --Will Follow tele.  3.  HTN Says he was prev on meds but came off @ some point b/c things normalized. He does not have a PCP. --Follow pressures in setting of #1 and add antihypertensive agent if appropriate. --Avoid AVN blocking agents.    4.  Tobacco Abuse --Cessation advised.   Electronic Signatures: Rogelia Mire (NP)  (Signed 14-Mar-16 13:12)  Authored: General Aspect/Present Illness, History and Physical Exam, Review of System, Home Medications, Labs, EKG , Radiology, Allergies, Impression/Plan Ida Rogue (MD)  (Signed 14-Mar-16 19:16)  Authored: General Aspect/Present Illness, History and Physical Exam, Review of System, EKG , Radiology, Vital Signs/Nurse's Notes, Impression/Plan  Co-Signer: General Aspect/Present Illness, History and Physical Exam, Review of System, Home Medications, Labs, EKG , Radiology, Allergies, Impression/Plan   Last Updated: 14-Mar-16 19:16 by Ida Rogue (MD)

## 2015-04-20 ENCOUNTER — Telehealth: Payer: Self-pay

## 2015-04-20 NOTE — Telephone Encounter (Signed)
Reviewed results of 30 day monitor w/ pt:  "NSR w/ sinus bradycardia overnight (HR down to 39 at 5:10 am)"  Pt verbalizes understanding and will call back w/ any questions or concerns.

## 2015-04-22 ENCOUNTER — Encounter (INDEPENDENT_AMBULATORY_CARE_PROVIDER_SITE_OTHER): Payer: Self-pay

## 2015-04-22 ENCOUNTER — Other Ambulatory Visit: Payer: Self-pay

## 2015-04-22 DIAGNOSIS — R001 Bradycardia, unspecified: Secondary | ICD-10-CM

## 2016-03-25 IMAGING — CR DG CHEST 1V PORT
1 series · 1 of 1 positions shown · non-contrast
Comparison: Portable exam 2509 hours without priors for comparison.

CLINICAL DATA: Began having difficulty speaking and collecting
thoughts yesterday, went away then came back this morning, history
smoking, difficulty taking deep breath, weakness, slurred speech

EXAM:
PORTABLE CHEST - 1 VIEW

[ap]
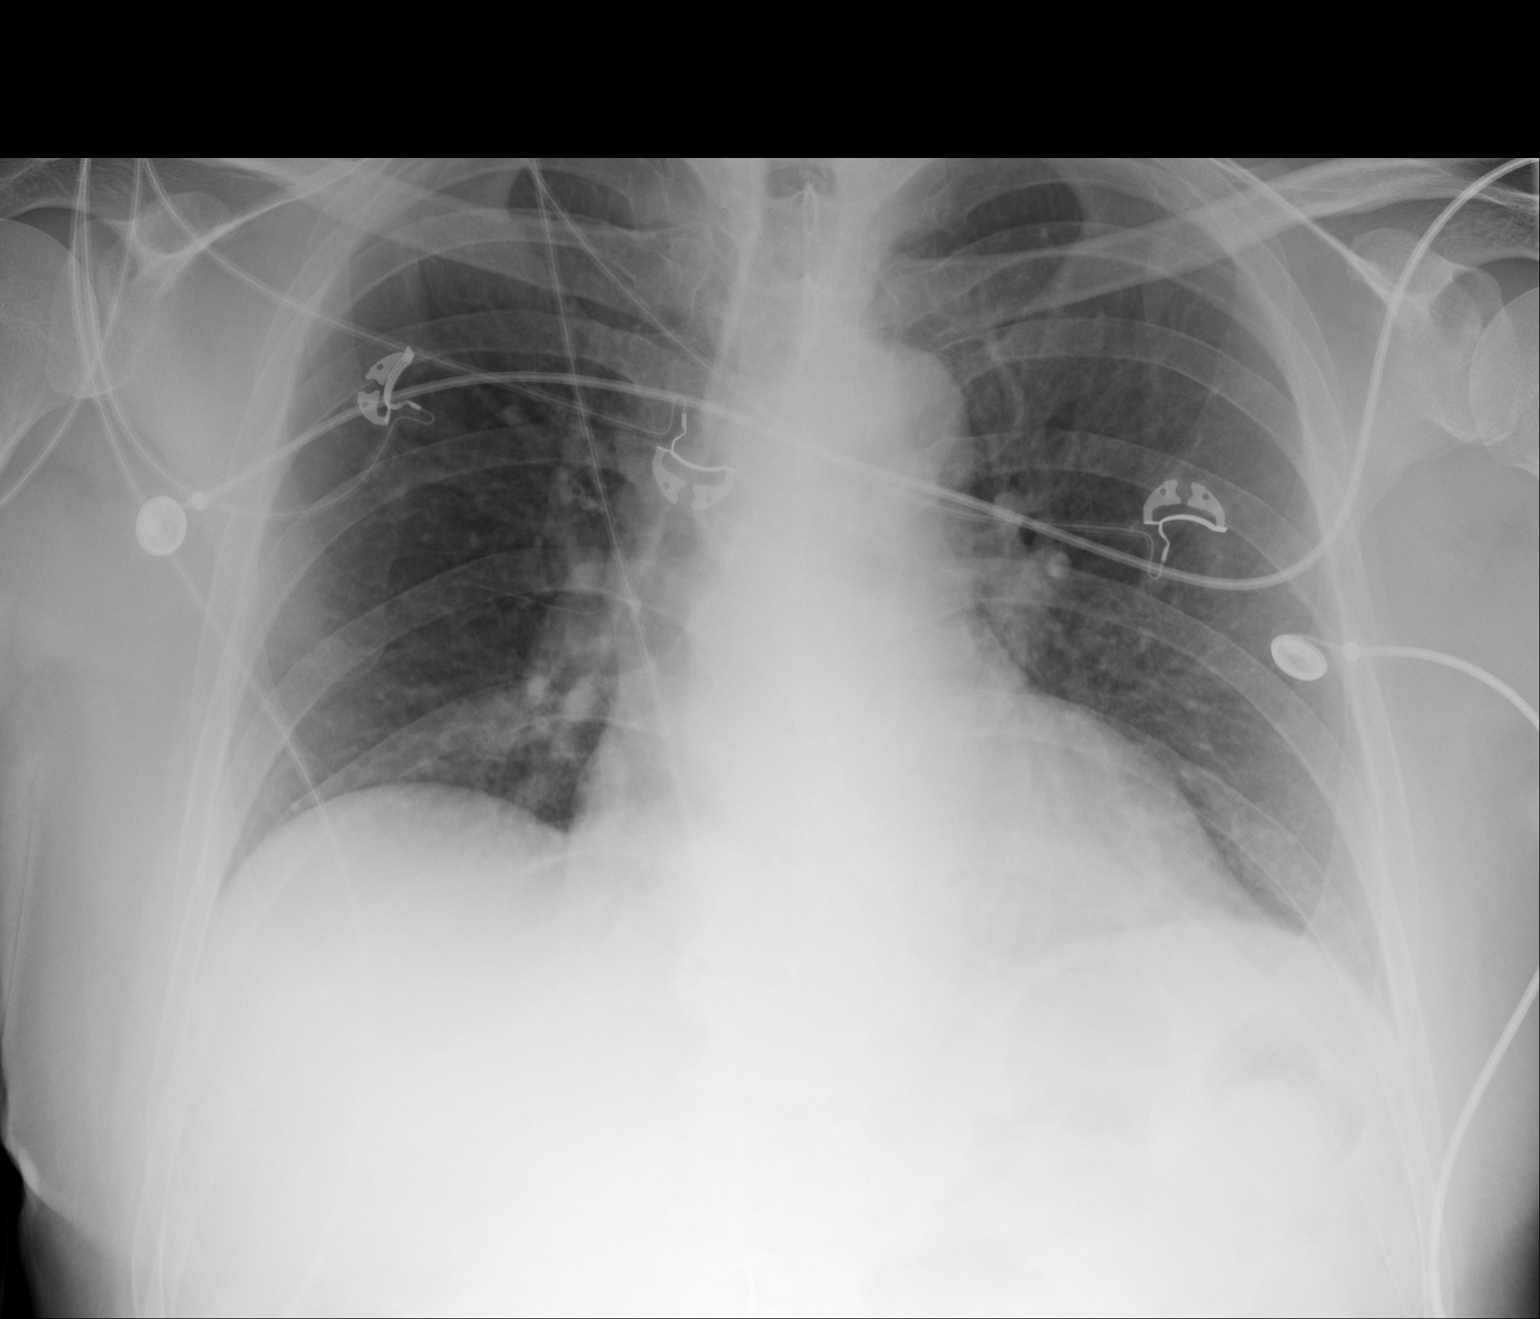

[1 of 1 positions shown; findings below may reference images not displayed]

FINDINGS: Minimal enlargement of cardiac silhouette.

Mediastinal contours and pulmonary vascularity normal.

Lungs grossly clear.

No pleural effusion, pneumothorax or acute osseous findings.
IMPRESSION: Minimal enlargement of cardiac silhouette.

No acute abnormalities.

## 2016-03-25 IMAGING — CT CT HEAD WITHOUT CONTRAST
1 series · 16 of 30 positions shown, 20 images · non-contrast
Comparison: None.

CLINICAL DATA: Stroke. Right-sided weakness and slurred speech
since yesterday. Right-sided facial droop.

EXAM:
CT HEAD WITHOUT CONTRAST
TECHNIQUE: Contiguous axial images were obtained from the base of the skull
through the vertex without intravenous contrast.

[Series 2: head wo · axial · 0.44mm/px · z∈[-134,-8]mm · 16 of 32 slices shown, 20 images]
[im 2/32  brain]
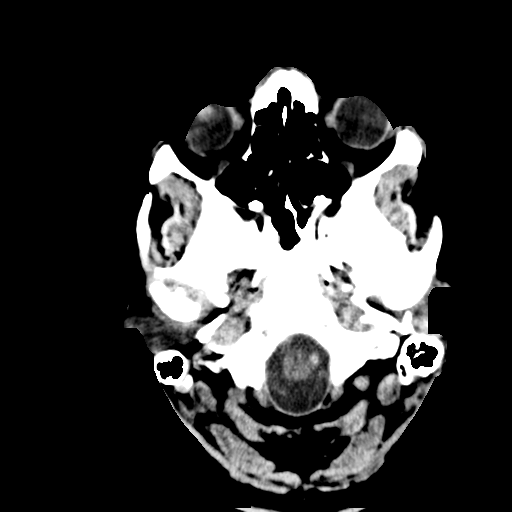
[im 2/32  bone]
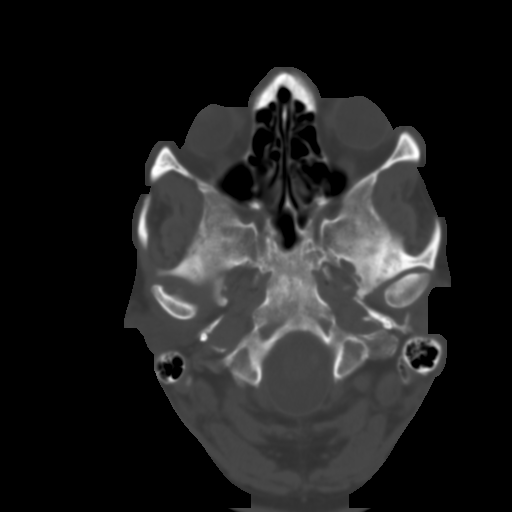
[im 4/32  brain]
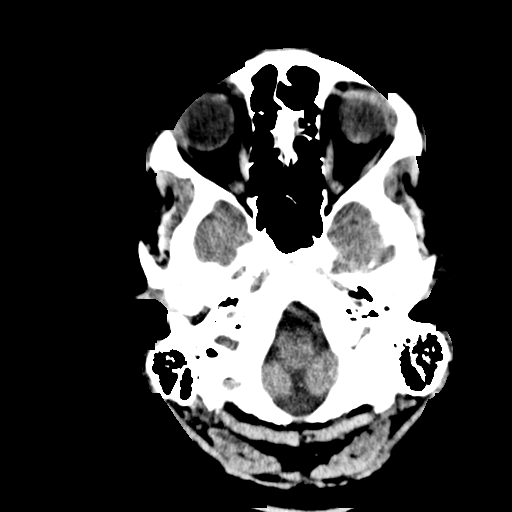
[im 6/32  brain]
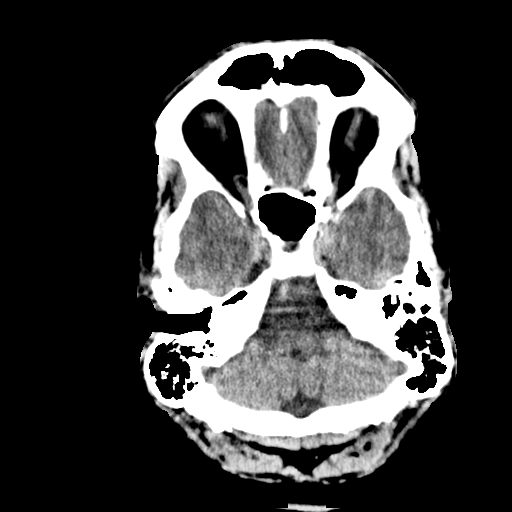
[im 8/32  brain]
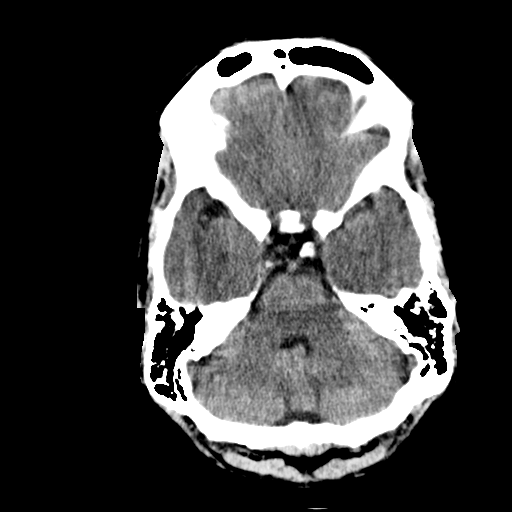
[im 9/32  brain]
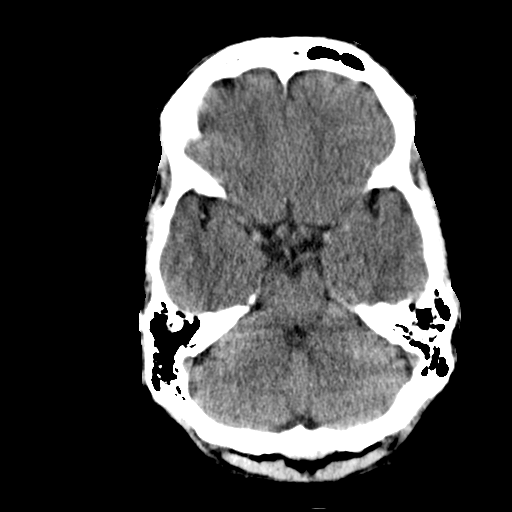
[im 9/32  bone]
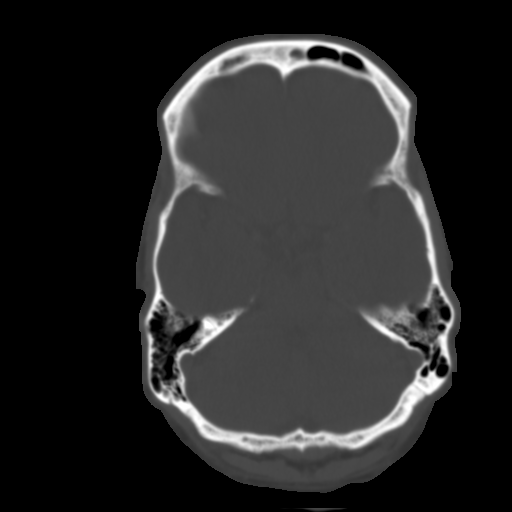
[im 11/32  brain]
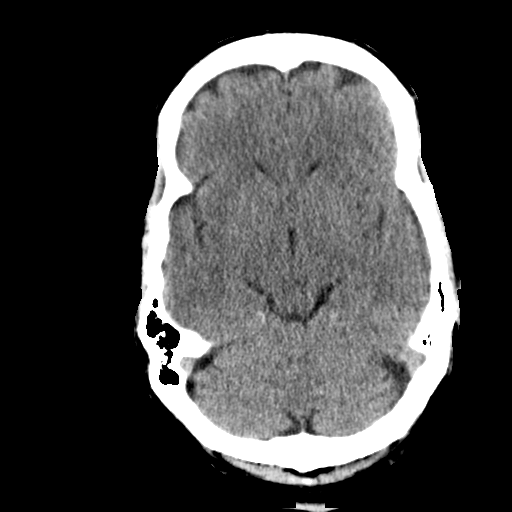
[im 13/32  brain]
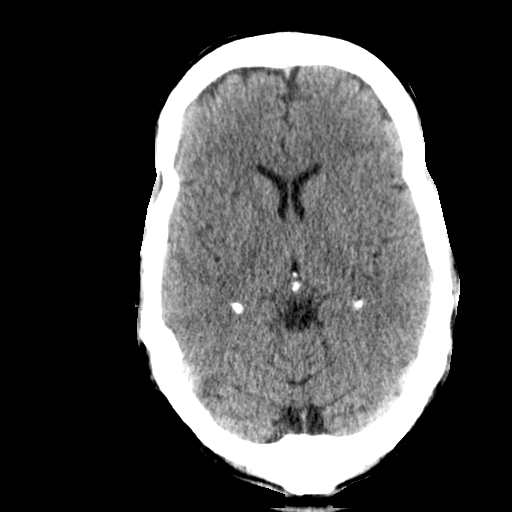
[im 15/32  brain]
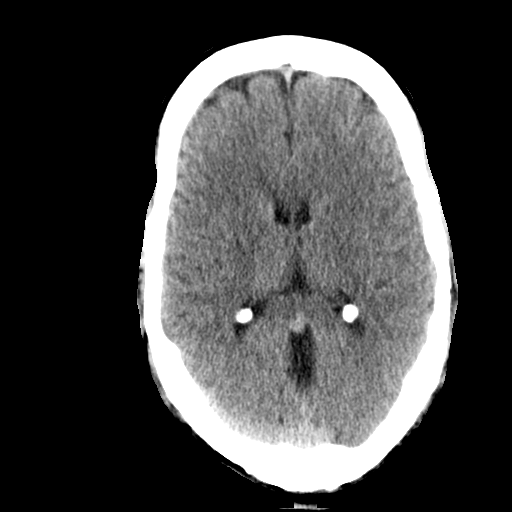
[im 17/32  brain]
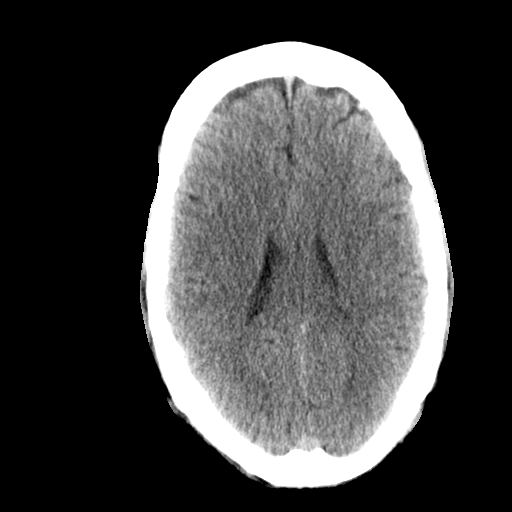
[im 17/32  bone]
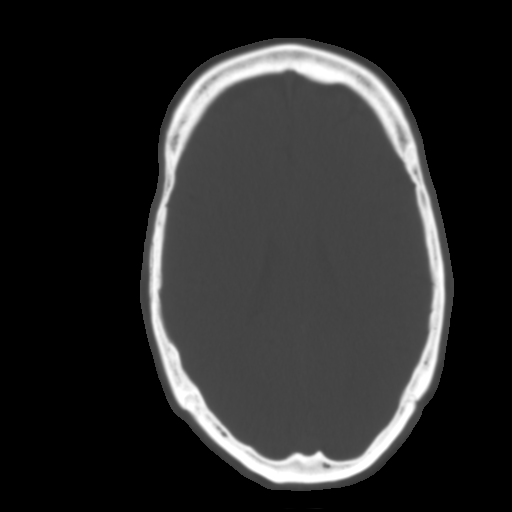
[im 19/32  brain]
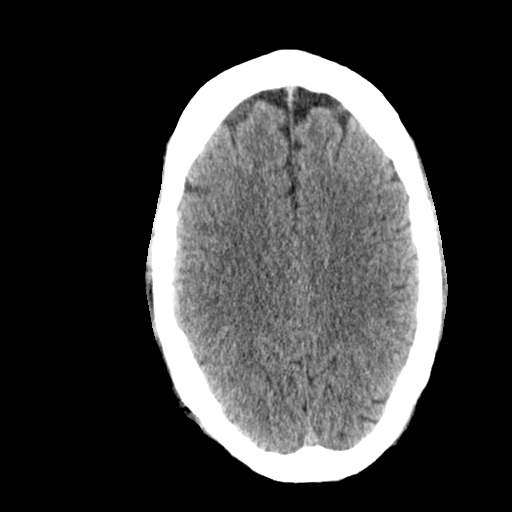
[im 21/32  brain]
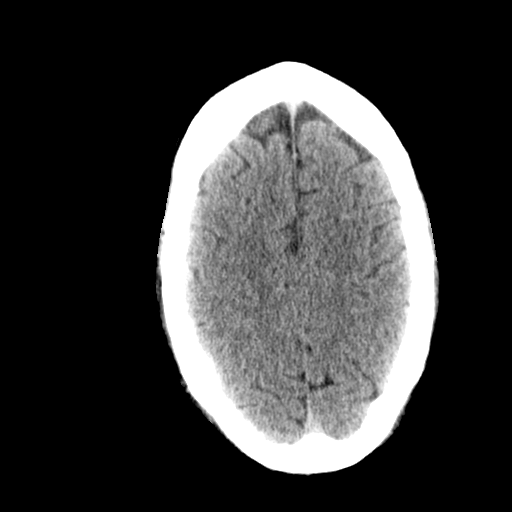
[im 23/32  brain]
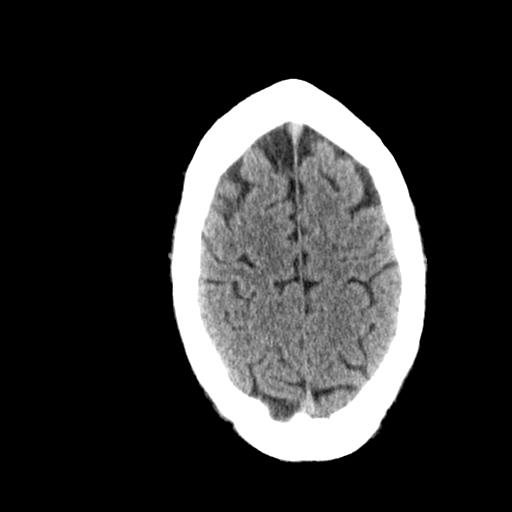
[im 24/32  brain]
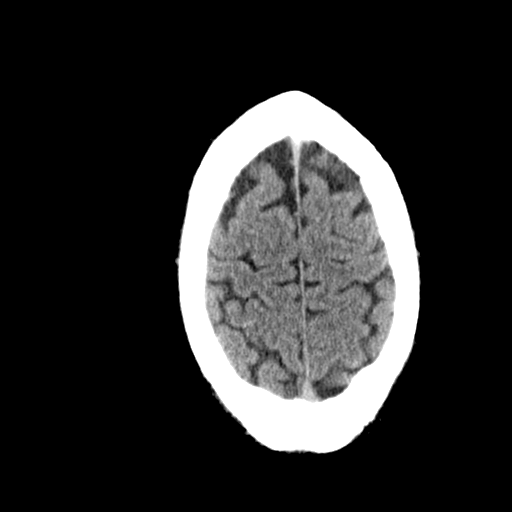
[im 24/32  bone]
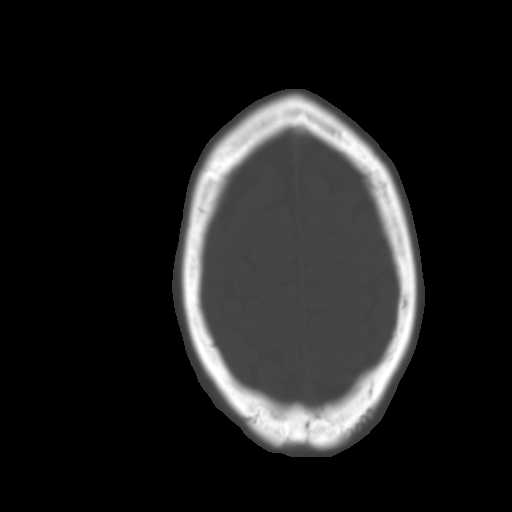
[im 26/32  brain]
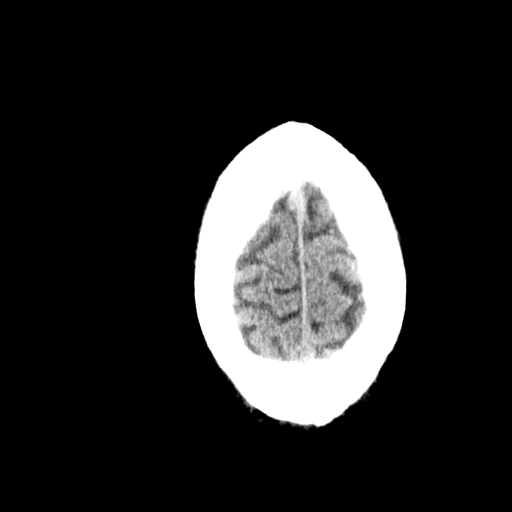
[im 28/32  brain]
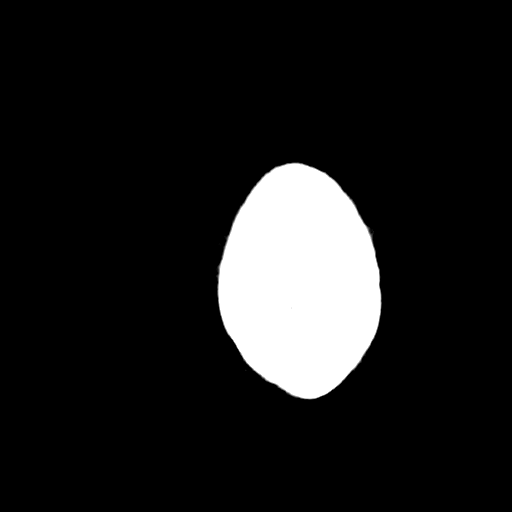
[im 30/32  brain]
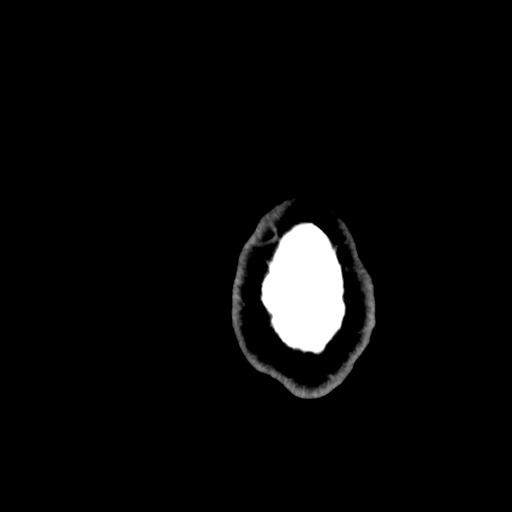

[16 of 30 positions shown; findings below may reference images not displayed]

FINDINGS: The ventricles and sulci are within normal limits for age. There is
no evidence of acute infarct, intracranial hemorrhage, mass, midline
shift, or extra-axial collection. The orbits are unremarkable. The
visualized paranasal sinuses and mastoid air cells are clear. There
is no evidence of acute fracture.
IMPRESSION: Negative.

## 2016-03-25 IMAGING — US US CAROTID DUPLEX BILAT
1 series · 13 of 24 positions shown · non-contrast
Comparison: None.

CLINICAL DATA: CVA.  History of smoking.

EXAM:
BILATERAL CAROTID DUPLEX ULTRASOUND
TECHNIQUE: Gray scale imaging, color Doppler and duplex ultrasound were
performed of bilateral carotid and vertebral arteries in the neck.

[Series 1: us carotid duplex bilat · 0.07mm/px · 13 of 62 slices shown]
[im 1/62]
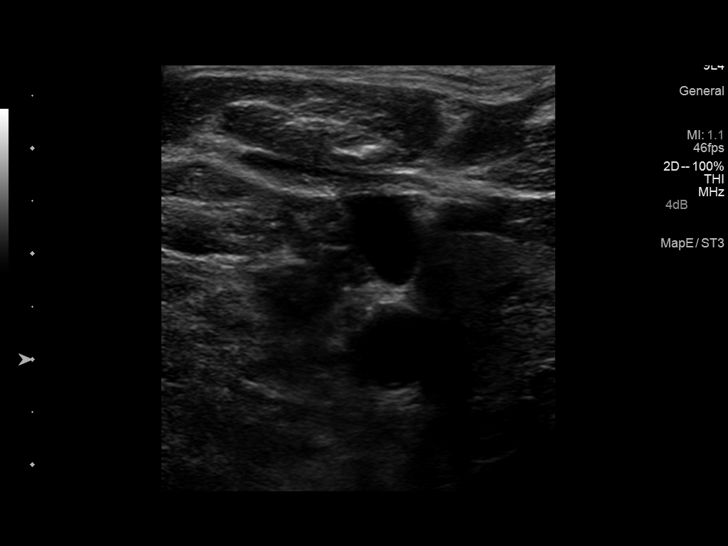
[im 6/62]
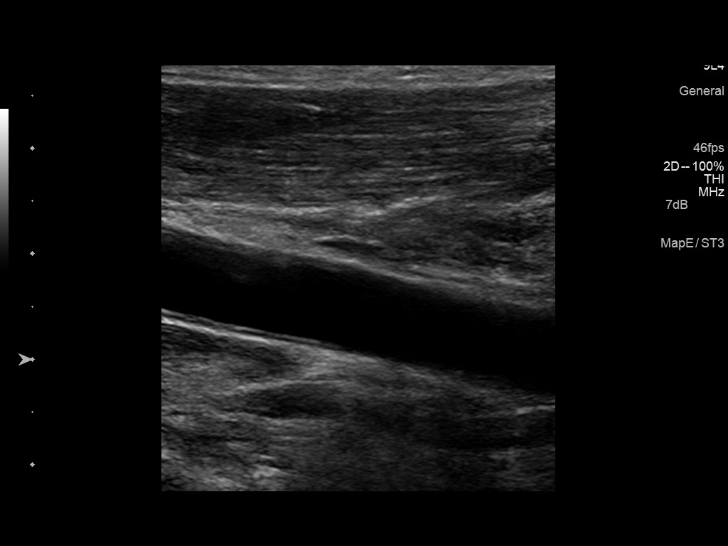
[im 11/62]
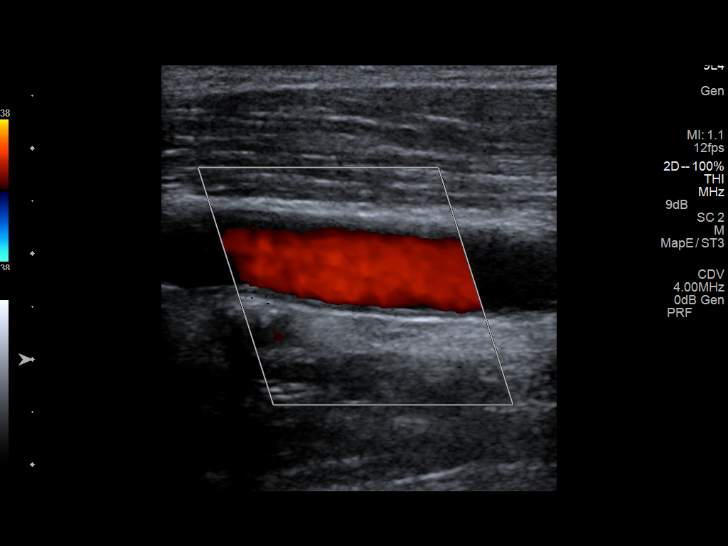
[im 16/62]
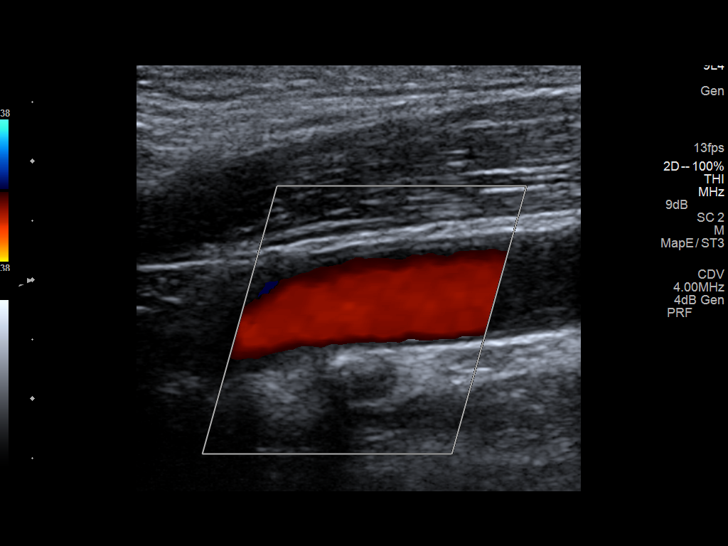
[im 22/62]
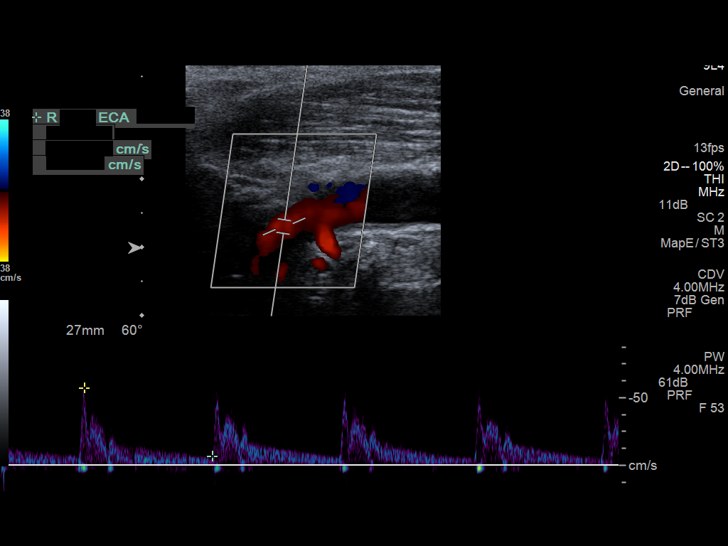
[im 27/62]
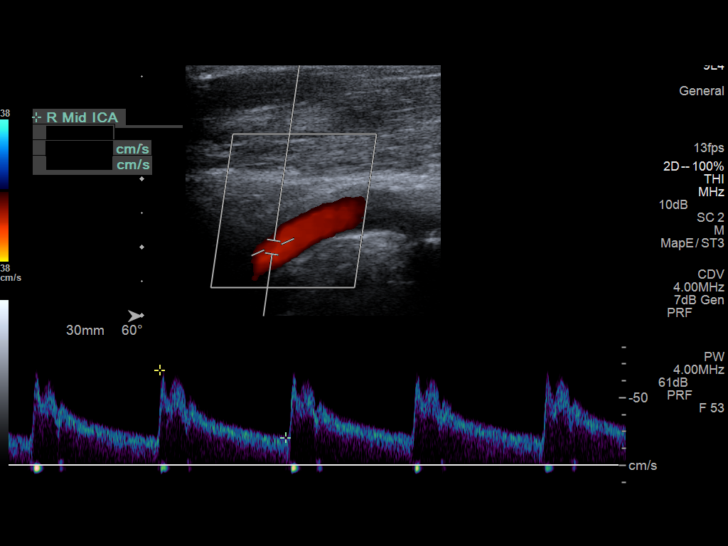
[im 32/62]
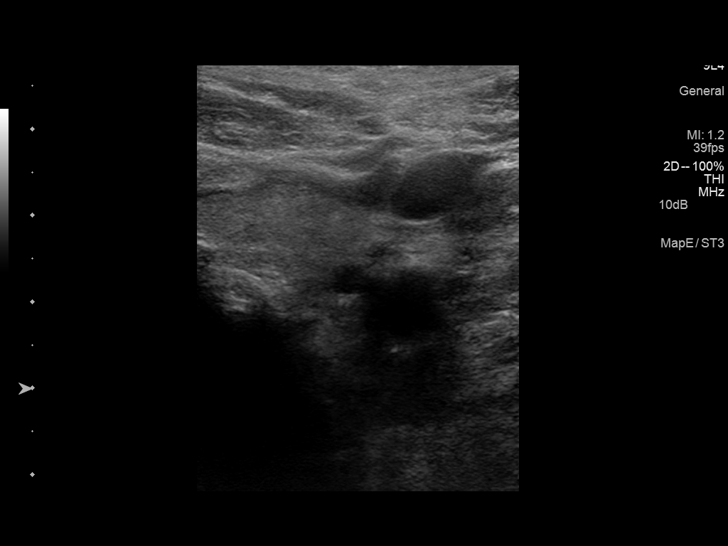
[im 35/62]
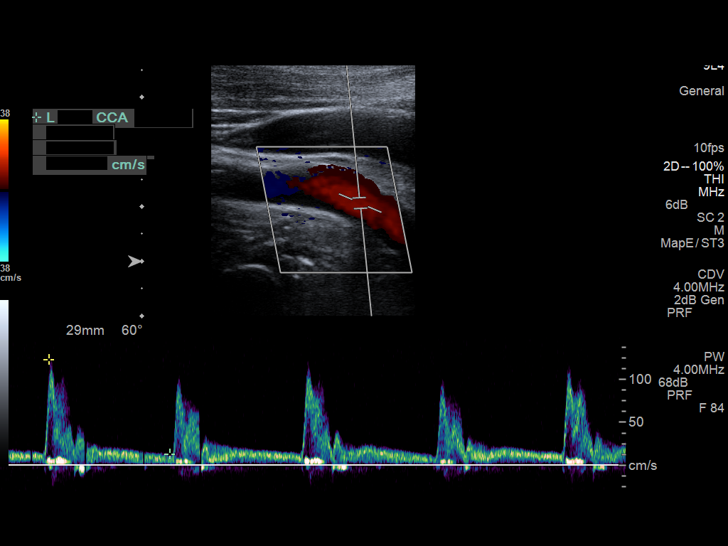
[im 40/62]
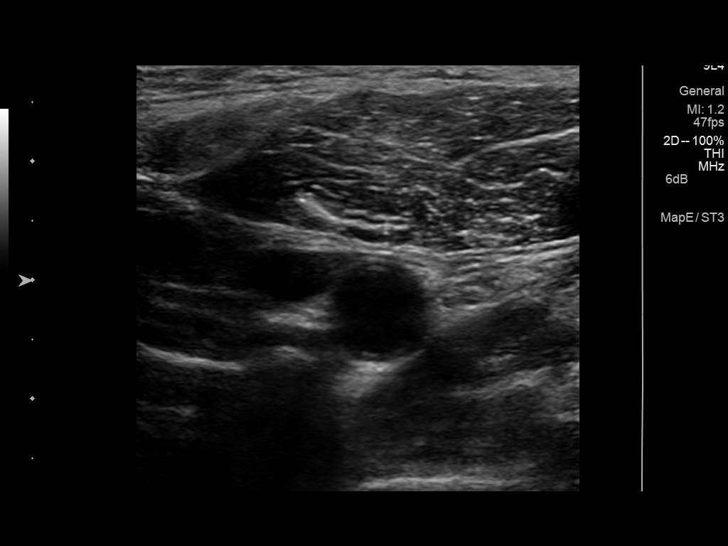
[im 46/62]
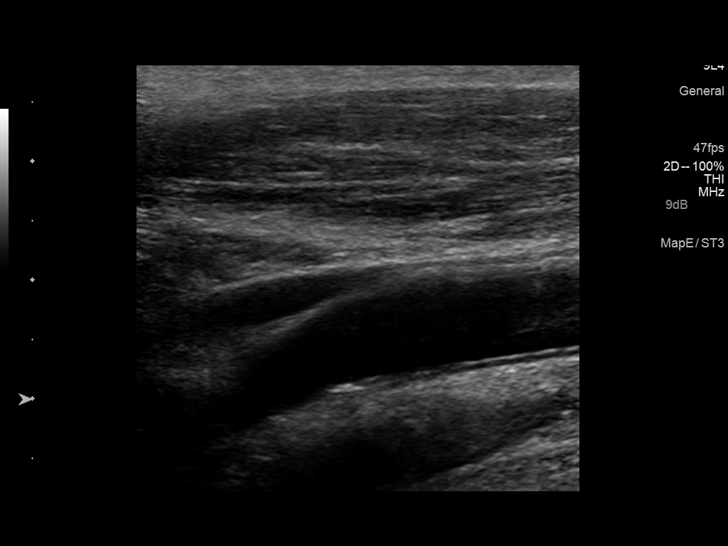
[im 51/62]
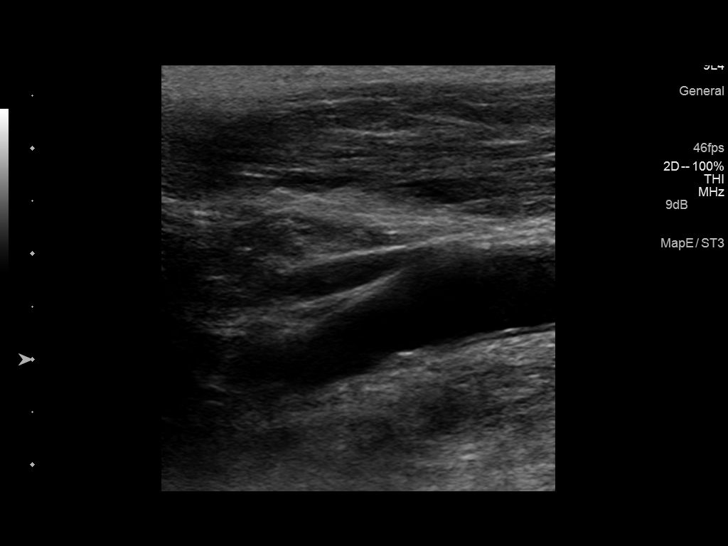
[im 56/62]
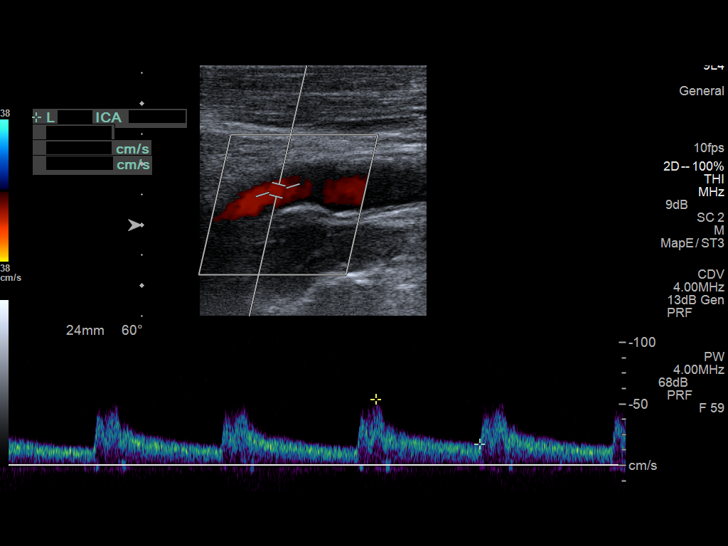
[im 62/62]
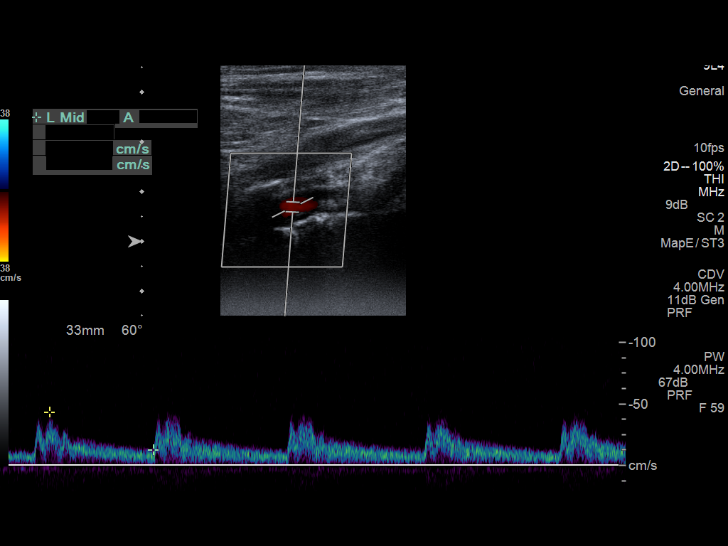

[13 of 24 positions shown; findings below may reference images not displayed]

FINDINGS: Criteria: Quantification of carotid stenosis is based on velocity
parameters that correlate the residual internal carotid diameter
with NASCET-based stenosis levels, using the diameter of the distal
internal carotid lumen as the denominator for stenosis measurement.

The following velocity measurements were obtained:

RIGHT

ICA:  71/21 cm/sec

CCA:  92/15 cm/sec

SYSTOLIC ICA/CCA RATIO:

DIASTOLIC ICA/CCA RATIO:

ECA:  57 cm/sec

LEFT

ICA:  76/28 81/13 cm/sec

CCA:  81/13 cm/sec

SYSTOLIC ICA/CCA RATIO:

DIASTOLIC ICA/CCA RATIO:

ECA:  73 cm/sec

RIGHT CAROTID ARTERY: There is no grayscale evidence of significant
intimal thickening or atherosclerotic plaque affecting the
interrogated portions of the right carotid system. There are no
elevated peak systolic velocities within the interrogated course of
the right internal carotid artery to suggest a hemodynamically
significant stenosis.

RIGHT VERTEBRAL ARTERY:  Antegrade flow

LEFT CAROTID ARTERY: There is a very minimal amount of intimal wall
thickening within the left carotid bulb (images 46 and 48),
extending to involve the origin and proximal aspect of the left
internal carotid artery (image 56), not resulting in elevated peak
systolic velocities within the interrogated course the left internal
carotid artery to suggest a hemodynamically significant stenosis.

LEFT VERTEBRAL ARTERY:  Antegrade flow
IMPRESSION: 1. Very minimal amount of left-sided intimal wall thickening and
atherosclerotic plaque, not resulting in a hemodynamically
significant stenosis.
2. Normal sonographic evaluation of the right carotid system.

## 2016-04-17 DIAGNOSIS — M1990 Unspecified osteoarthritis, unspecified site: Secondary | ICD-10-CM

## 2016-06-23 ENCOUNTER — Telehealth: Payer: Self-pay | Admitting: Cardiovascular Disease

## 2016-06-23 NOTE — Telephone Encounter (Signed)
Received records request from [place]  , forwarded/Faxed  to [facility] for processing.

## 2017-07-13 DIAGNOSIS — I10 Essential (primary) hypertension: Secondary | ICD-10-CM | POA: Diagnosis not present

## 2017-08-08 DIAGNOSIS — M5116 Intervertebral disc disorders with radiculopathy, lumbar region: Secondary | ICD-10-CM | POA: Diagnosis not present

## 2017-08-17 ENCOUNTER — Other Ambulatory Visit: Payer: Self-pay | Admitting: *Deleted

## 2017-08-17 NOTE — Patient Outreach (Signed)
Telephone call to patient for follow up on [place]. No answer telephone , left voicemail requesting return phone call.  PLAN Outreach pt next week  Jacqlyn Larsen [employer], [place] Coordinator [phone]

## 2017-08-24 ENCOUNTER — Encounter: Payer: Self-pay | Admitting: *Deleted

## 2017-08-24 ENCOUNTER — Other Ambulatory Visit: Payer: Self-pay | Admitting: *Deleted

## 2017-08-24 NOTE — Patient Outreach (Signed)
Telephone call to patient for follow up on [place]. Spoke with pt, HIPAA verified, pt reports he sees primary MD Dr. Emily Filbert regularly, has all medications and can afford, pt reports he is independent with ADL's and drives.  Pt states he has no health related needs and no needs for care management.  RN CM mailed successful outreach letter to patient's home.  Jacqlyn Larsen [facility], [place] Coordinator [phone]

## 2017-09-10 DIAGNOSIS — Z0000 Encounter for general adult medical examination without abnormal findings: Secondary | ICD-10-CM | POA: Diagnosis not present

## 2017-09-10 DIAGNOSIS — Z125 Encounter for screening for malignant neoplasm of prostate: Secondary | ICD-10-CM | POA: Diagnosis not present

## 2017-09-17 DIAGNOSIS — Z125 Encounter for screening for malignant neoplasm of prostate: Secondary | ICD-10-CM | POA: Diagnosis not present

## 2017-09-17 DIAGNOSIS — I679 Cerebrovascular disease, unspecified: Secondary | ICD-10-CM | POA: Diagnosis not present

## 2017-09-17 DIAGNOSIS — Z0000 Encounter for general adult medical examination without abnormal findings: Secondary | ICD-10-CM | POA: Diagnosis not present

## 2017-09-17 DIAGNOSIS — E538 Deficiency of other specified B group vitamins: Secondary | ICD-10-CM | POA: Diagnosis not present

## 2017-09-17 DIAGNOSIS — Z79899 Other long term (current) drug therapy: Secondary | ICD-10-CM | POA: Diagnosis not present

## 2017-09-17 DIAGNOSIS — Z23 Encounter for immunization: Secondary | ICD-10-CM | POA: Diagnosis not present

## 2017-10-31 DIAGNOSIS — G5702 Lesion of sciatic nerve, left lower limb: Secondary | ICD-10-CM | POA: Diagnosis not present

## 2017-10-31 DIAGNOSIS — S39012A Strain of muscle, fascia and tendon of lower back, initial encounter: Secondary | ICD-10-CM | POA: Diagnosis not present

## 2018-01-04 DIAGNOSIS — M545 Low back pain: Secondary | ICD-10-CM | POA: Diagnosis not present

## 2018-01-04 DIAGNOSIS — M2550 Pain in unspecified joint: Secondary | ICD-10-CM | POA: Diagnosis not present

## 2018-02-27 DIAGNOSIS — I679 Cerebrovascular disease, unspecified: Secondary | ICD-10-CM | POA: Diagnosis not present

## 2018-02-27 DIAGNOSIS — F0150 Vascular dementia without behavioral disturbance: Secondary | ICD-10-CM | POA: Diagnosis not present

## 2018-02-27 DIAGNOSIS — F331 Major depressive disorder, recurrent, moderate: Secondary | ICD-10-CM | POA: Diagnosis not present

## 2018-02-27 DIAGNOSIS — R5382 Chronic fatigue, unspecified: Secondary | ICD-10-CM | POA: Diagnosis not present

## 2018-03-29 DIAGNOSIS — R5382 Chronic fatigue, unspecified: Secondary | ICD-10-CM | POA: Diagnosis not present

## 2018-04-15 DIAGNOSIS — H9201 Otalgia, right ear: Secondary | ICD-10-CM | POA: Diagnosis not present

## 2018-04-15 DIAGNOSIS — I10 Essential (primary) hypertension: Secondary | ICD-10-CM | POA: Diagnosis not present

## 2018-05-31 DIAGNOSIS — R002 Palpitations: Secondary | ICD-10-CM | POA: Diagnosis not present

## 2018-05-31 DIAGNOSIS — M79602 Pain in left arm: Secondary | ICD-10-CM | POA: Diagnosis not present

## 2018-06-05 DIAGNOSIS — L820 Inflamed seborrheic keratosis: Secondary | ICD-10-CM | POA: Diagnosis not present

## 2018-08-12 DIAGNOSIS — R5382 Chronic fatigue, unspecified: Secondary | ICD-10-CM | POA: Diagnosis not present

## 2018-09-12 DIAGNOSIS — Z13220 Encounter for screening for lipoid disorders: Secondary | ICD-10-CM | POA: Diagnosis not present

## 2018-09-12 DIAGNOSIS — Z125 Encounter for screening for malignant neoplasm of prostate: Secondary | ICD-10-CM | POA: Diagnosis not present

## 2018-09-12 DIAGNOSIS — E538 Deficiency of other specified B group vitamins: Secondary | ICD-10-CM | POA: Diagnosis not present

## 2018-09-12 DIAGNOSIS — Z79899 Other long term (current) drug therapy: Secondary | ICD-10-CM | POA: Diagnosis not present

## 2018-09-18 DIAGNOSIS — Z23 Encounter for immunization: Secondary | ICD-10-CM | POA: Diagnosis not present

## 2018-09-18 DIAGNOSIS — Z0000 Encounter for general adult medical examination without abnormal findings: Secondary | ICD-10-CM | POA: Diagnosis not present

## 2018-09-18 DIAGNOSIS — E538 Deficiency of other specified B group vitamins: Secondary | ICD-10-CM | POA: Diagnosis not present

## 2018-10-21 DIAGNOSIS — R5383 Other fatigue: Secondary | ICD-10-CM | POA: Diagnosis not present

## 2018-12-23 DIAGNOSIS — L820 Inflamed seborrheic keratosis: Secondary | ICD-10-CM | POA: Diagnosis not present

## 2019-01-23 DIAGNOSIS — F0150 Vascular dementia without behavioral disturbance: Secondary | ICD-10-CM | POA: Diagnosis not present

## 2019-01-23 DIAGNOSIS — L820 Inflamed seborrheic keratosis: Secondary | ICD-10-CM | POA: Diagnosis not present

## 2019-01-23 DIAGNOSIS — F331 Major depressive disorder, recurrent, moderate: Secondary | ICD-10-CM | POA: Diagnosis not present

## 2019-03-20 DIAGNOSIS — F331 Major depressive disorder, recurrent, moderate: Secondary | ICD-10-CM | POA: Diagnosis not present

## 2019-03-20 DIAGNOSIS — I10 Essential (primary) hypertension: Secondary | ICD-10-CM | POA: Diagnosis not present

## 2019-03-20 DIAGNOSIS — E538 Deficiency of other specified B group vitamins: Secondary | ICD-10-CM | POA: Diagnosis not present

## 2019-03-20 DIAGNOSIS — Z125 Encounter for screening for malignant neoplasm of prostate: Secondary | ICD-10-CM | POA: Diagnosis not present

## 2019-03-20 DIAGNOSIS — Z0000 Encounter for general adult medical examination without abnormal findings: Secondary | ICD-10-CM | POA: Diagnosis not present

## 2019-05-01 DIAGNOSIS — R5382 Chronic fatigue, unspecified: Secondary | ICD-10-CM | POA: Diagnosis not present

## 2019-05-01 DIAGNOSIS — I952 Hypotension due to drugs: Secondary | ICD-10-CM | POA: Diagnosis not present

## 2019-07-14 DIAGNOSIS — I208 Other forms of angina pectoris: Secondary | ICD-10-CM | POA: Diagnosis not present

## 2019-07-14 DIAGNOSIS — R5382 Chronic fatigue, unspecified: Secondary | ICD-10-CM | POA: Diagnosis not present

## 2019-07-22 DIAGNOSIS — I208 Other forms of angina pectoris: Secondary | ICD-10-CM | POA: Diagnosis not present

## 2019-08-04 DIAGNOSIS — Z20828 Contact with and (suspected) exposure to other viral communicable diseases: Secondary | ICD-10-CM | POA: Diagnosis not present

## 2019-09-15 DIAGNOSIS — F331 Major depressive disorder, recurrent, moderate: Secondary | ICD-10-CM | POA: Diagnosis not present

## 2019-09-15 DIAGNOSIS — E538 Deficiency of other specified B group vitamins: Secondary | ICD-10-CM | POA: Diagnosis not present

## 2019-09-15 DIAGNOSIS — Z125 Encounter for screening for malignant neoplasm of prostate: Secondary | ICD-10-CM | POA: Diagnosis not present

## 2019-09-22 DIAGNOSIS — E538 Deficiency of other specified B group vitamins: Secondary | ICD-10-CM | POA: Diagnosis not present

## 2019-09-22 DIAGNOSIS — Z0000 Encounter for general adult medical examination without abnormal findings: Secondary | ICD-10-CM | POA: Diagnosis not present

## 2019-09-22 DIAGNOSIS — E782 Mixed hyperlipidemia: Secondary | ICD-10-CM | POA: Diagnosis not present

## 2019-09-22 DIAGNOSIS — F331 Major depressive disorder, recurrent, moderate: Secondary | ICD-10-CM | POA: Diagnosis not present

## 2019-09-22 DIAGNOSIS — Z23 Encounter for immunization: Secondary | ICD-10-CM | POA: Diagnosis not present

## 2020-03-01 ENCOUNTER — Other Ambulatory Visit: Payer: Self-pay

## 2020-03-01 ENCOUNTER — Ambulatory Visit: Payer: Self-pay | Attending: Internal Medicine

## 2020-03-01 DIAGNOSIS — Z23 Encounter for immunization: Secondary | ICD-10-CM

## 2020-03-01 NOTE — Progress Notes (Signed)
   Covid-19 Vaccination Clinic  Name:  Alejandro Wall    MRN: [id number] DOB: 09-15-1956  03/01/2020  Alejandro Wall was observed post Covid-19 immunization for 15 minutes without incident. He was provided with Vaccine Information Sheet and instruction to access the V-Safe system.   Alejandro Wall was instructed to call 911 with any severe reactions post vaccine: Marland Kitchen Difficulty breathing  . Swelling of face and throat  . A fast heartbeat  . A bad rash all over body  . Dizziness and weakness   Immunizations Administered    Name Date Dose VIS Date Route   Pfizer COVID-19 Vaccine 03/01/2020  9:44 AM 0.3 mL 11/14/2019 Intramuscular   Manufacturer: [place]   Lot: [id number]   [place]: [id number]

## 2020-03-15 DIAGNOSIS — E538 Deficiency of other specified B group vitamins: Secondary | ICD-10-CM | POA: Diagnosis not present

## 2020-03-15 DIAGNOSIS — E782 Mixed hyperlipidemia: Secondary | ICD-10-CM | POA: Diagnosis not present

## 2020-03-22 DIAGNOSIS — Z0000 Encounter for general adult medical examination without abnormal findings: Secondary | ICD-10-CM | POA: Diagnosis not present

## 2020-03-22 DIAGNOSIS — F0150 Vascular dementia without behavioral disturbance: Secondary | ICD-10-CM | POA: Diagnosis not present

## 2020-03-22 DIAGNOSIS — E782 Mixed hyperlipidemia: Secondary | ICD-10-CM | POA: Diagnosis not present

## 2020-03-22 DIAGNOSIS — F331 Major depressive disorder, recurrent, moderate: Secondary | ICD-10-CM | POA: Diagnosis not present

## 2020-03-22 DIAGNOSIS — Z125 Encounter for screening for malignant neoplasm of prostate: Secondary | ICD-10-CM | POA: Diagnosis not present

## 2020-03-22 DIAGNOSIS — E538 Deficiency of other specified B group vitamins: Secondary | ICD-10-CM | POA: Diagnosis not present

## 2020-03-24 ENCOUNTER — Ambulatory Visit: Payer: Self-pay | Attending: Internal Medicine

## 2020-03-24 DIAGNOSIS — Z23 Encounter for immunization: Secondary | ICD-10-CM

## 2020-03-24 NOTE — Progress Notes (Signed)
   Covid-19 Vaccination Clinic  Name:  Alejandro Wall    MRN: [id number] DOB: Oct 22, 1956  03/24/2020  [name] was observed post Covid-19 immunization for 15 minutes without incident. He was provided with Vaccine Information Sheet and instruction to access the V-Safe system.   [name] was instructed to call 911 with any severe reactions post vaccine: Marland Kitchen Difficulty breathing  . Swelling of face and throat  . A fast heartbeat  . A bad rash all over body  . Dizziness and weakness   Immunizations Administered    Name Date Dose VIS Date Route   Pfizer COVID-19 Vaccine 03/24/2020 11:47 AM 0.3 mL 01/28/2019 Intramuscular   Manufacturer: [place]   Lot: [id number]   [place]: [id number]

## 2020-05-26 DIAGNOSIS — J0100 Acute maxillary sinusitis, unspecified: Secondary | ICD-10-CM | POA: Diagnosis not present

## 2020-07-06 DIAGNOSIS — R5382 Chronic fatigue, unspecified: Secondary | ICD-10-CM | POA: Diagnosis not present

## 2020-07-06 DIAGNOSIS — F331 Major depressive disorder, recurrent, moderate: Secondary | ICD-10-CM | POA: Diagnosis not present

## 2020-07-09 ENCOUNTER — Emergency Department
Admission: EM | Admit: 2020-07-09 | Discharge: 2020-07-09 | Disposition: A | Discharge status: Home / Self Care | Payer: PPO | Attending: Emergency Medicine | Admitting: Emergency Medicine

## 2020-07-09 ENCOUNTER — Encounter: Payer: Self-pay | Admitting: Emergency Medicine

## 2020-07-09 ENCOUNTER — Other Ambulatory Visit: Payer: Self-pay

## 2020-07-09 ENCOUNTER — Emergency Department: Payer: PPO

## 2020-07-09 DIAGNOSIS — R55 Syncope and collapse: Secondary | ICD-10-CM | POA: Insufficient documentation

## 2020-07-09 DIAGNOSIS — R531 Weakness: Secondary | ICD-10-CM | POA: Diagnosis not present

## 2020-07-09 DIAGNOSIS — R42 Dizziness and giddiness: Secondary | ICD-10-CM | POA: Insufficient documentation

## 2020-07-09 DIAGNOSIS — W19XXXA Unspecified fall, initial encounter: Secondary | ICD-10-CM | POA: Diagnosis not present

## 2020-07-09 DIAGNOSIS — Z5321 Procedure and treatment not carried out due to patient leaving prior to being seen by health care provider: Secondary | ICD-10-CM | POA: Diagnosis not present

## 2020-07-09 DIAGNOSIS — R0902 Hypoxemia: Secondary | ICD-10-CM | POA: Diagnosis not present

## 2020-07-09 LAB — URINALYSIS, COMPLETE (UACMP) WITH MICROSCOPIC
Bacteria, UA: NONE SEEN
Bilirubin Urine: NEGATIVE
Glucose, UA: NEGATIVE mg/dL
Hgb urine dipstick: NEGATIVE
Ketones, ur: NEGATIVE mg/dL
Leukocytes,Ua: NEGATIVE
Nitrite: NEGATIVE
Protein, ur: NEGATIVE mg/dL
Specific Gravity, Urine: 1.024 (ref 1.005–1.030)
pH: 7 (ref 5.0–8.0)

## 2020-07-09 LAB — CBC
HCT: 45.4 % (ref 39.0–52.0)
Hemoglobin: 15.5 g/dL (ref 13.0–17.0)
MCH: 30 pg (ref 26.0–34.0)
MCHC: 34.1 g/dL (ref 30.0–36.0)
MCV: 87.8 fL (ref 80.0–100.0)
Platelets: 143 10*3/uL — ABNORMAL LOW (ref 150–400)
RBC: 5.17 MIL/uL (ref 4.22–5.81)
RDW: 13.8 % (ref 11.5–15.5)
WBC: 7.9 10*3/uL (ref 4.0–10.5)
nRBC: 0 % (ref 0.0–0.2)

## 2020-07-09 LAB — COMPREHENSIVE METABOLIC PANEL
ALT: 19 U/L (ref 0–44)
AST: 20 U/L (ref 15–41)
Albumin: 4.4 g/dL (ref 3.5–5.0)
Alkaline Phosphatase: 67 U/L (ref 38–126)
Anion gap: 10 (ref 5–15)
BUN: 12 mg/dL (ref 8–23)
CO2: 25 mmol/L (ref 22–32)
Calcium: 9 mg/dL (ref 8.9–10.3)
Chloride: 102 mmol/L (ref 98–111)
Creatinine, Ser: 0.97 mg/dL (ref 0.61–1.24)
GFR calc Af Amer: >60 mL/min (ref >60)
GFR calc non Af Amer: >60 mL/min (ref >60)
Glucose, Bld: 108 mg/dL — ABNORMAL HIGH (ref 70–99)
Potassium: 4.5 mmol/L (ref 3.5–5.1)
Sodium: 137 mmol/L (ref 135–145)
Total Bilirubin: 0.9 mg/dL (ref 0.3–1.2)
Total Protein: 7.7 g/dL (ref 6.5–8.1)

## 2020-07-09 NOTE — ED Notes (Signed)
Pt leaving. Would not like to wait anymore. NAD. Ambulatory.

## 2020-07-09 NOTE — ED Triage Notes (Signed)
Pt here for dizziness since Wednesday.  Reports feels both lightheaded and like room spinning.  Has been off balance and fell because of it.  Hx CVA.  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

## 2020-07-09 NOTE — ED Triage Notes (Signed)
First RN note: Pt presents to ED via [facility] with c/o near syncope and dizziness. Per EMS pt was taken off wellbutrin on 8/3, has been having dizziness since then.    132/92

## 2020-07-15 DIAGNOSIS — H8110 Benign paroxysmal vertigo, unspecified ear: Secondary | ICD-10-CM | POA: Diagnosis not present

## 2020-07-15 DIAGNOSIS — R55 Syncope and collapse: Secondary | ICD-10-CM | POA: Diagnosis not present

## 2020-09-15 DIAGNOSIS — E782 Mixed hyperlipidemia: Secondary | ICD-10-CM | POA: Diagnosis not present

## 2020-09-15 DIAGNOSIS — E538 Deficiency of other specified B group vitamins: Secondary | ICD-10-CM | POA: Diagnosis not present

## 2020-09-15 DIAGNOSIS — Z125 Encounter for screening for malignant neoplasm of prostate: Secondary | ICD-10-CM | POA: Diagnosis not present

## 2020-09-22 DIAGNOSIS — E782 Mixed hyperlipidemia: Secondary | ICD-10-CM | POA: Diagnosis not present

## 2020-09-22 DIAGNOSIS — Z0000 Encounter for general adult medical examination without abnormal findings: Secondary | ICD-10-CM | POA: Diagnosis not present

## 2020-09-22 DIAGNOSIS — F331 Major depressive disorder, recurrent, moderate: Secondary | ICD-10-CM | POA: Diagnosis not present

## 2020-09-22 DIAGNOSIS — D696 Thrombocytopenia, unspecified: Secondary | ICD-10-CM | POA: Diagnosis not present

## 2020-09-22 DIAGNOSIS — E538 Deficiency of other specified B group vitamins: Secondary | ICD-10-CM | POA: Diagnosis not present

## 2020-09-22 DIAGNOSIS — Z23 Encounter for immunization: Secondary | ICD-10-CM | POA: Diagnosis not present

## 2022-12-20 ENCOUNTER — Emergency Department: Payer: Medicare Other

## 2022-12-20 ENCOUNTER — Other Ambulatory Visit: Payer: Self-pay

## 2022-12-20 ENCOUNTER — Inpatient Hospital Stay
Admission: EM | Admit: 2022-12-20 | Discharge: 2022-12-24 | DRG: 178 | Disposition: A | Discharge status: Home Health | Payer: Medicare Other | Attending: Internal Medicine | Admitting: Internal Medicine

## 2022-12-20 ENCOUNTER — Inpatient Hospital Stay: Payer: Medicare Other

## 2022-12-20 DIAGNOSIS — M4802 Spinal stenosis, cervical region: Secondary | ICD-10-CM | POA: Diagnosis present

## 2022-12-20 DIAGNOSIS — F32A Depression, unspecified: Secondary | ICD-10-CM

## 2022-12-20 DIAGNOSIS — U071 COVID-19: Secondary | ICD-10-CM | POA: Diagnosis not present

## 2022-12-20 DIAGNOSIS — Z833 Family history of diabetes mellitus: Secondary | ICD-10-CM

## 2022-12-20 DIAGNOSIS — Z825 Family history of asthma and other chronic lower respiratory diseases: Secondary | ICD-10-CM

## 2022-12-20 DIAGNOSIS — I10 Essential (primary) hypertension: Secondary | ICD-10-CM | POA: Diagnosis present

## 2022-12-20 DIAGNOSIS — E785 Hyperlipidemia, unspecified: Secondary | ICD-10-CM | POA: Diagnosis present

## 2022-12-20 DIAGNOSIS — M5440 Lumbago with sciatica, unspecified side: Secondary | ICD-10-CM | POA: Diagnosis not present

## 2022-12-20 DIAGNOSIS — M5412 Radiculopathy, cervical region: Secondary | ICD-10-CM | POA: Diagnosis present

## 2022-12-20 DIAGNOSIS — R531 Weakness: Secondary | ICD-10-CM | POA: Diagnosis present

## 2022-12-20 DIAGNOSIS — K219 Gastro-esophageal reflux disease without esophagitis: Secondary | ICD-10-CM | POA: Diagnosis present

## 2022-12-20 DIAGNOSIS — E871 Hypo-osmolality and hyponatremia: Secondary | ICD-10-CM | POA: Diagnosis not present

## 2022-12-20 DIAGNOSIS — R29898 Other symptoms and signs involving the musculoskeletal system: Secondary | ICD-10-CM | POA: Diagnosis not present

## 2022-12-20 DIAGNOSIS — G4700 Insomnia, unspecified: Secondary | ICD-10-CM

## 2022-12-20 DIAGNOSIS — Z8673 Personal history of transient ischemic attack (TIA), and cerebral infarction without residual deficits: Secondary | ICD-10-CM | POA: Diagnosis not present

## 2022-12-20 DIAGNOSIS — R911 Solitary pulmonary nodule: Secondary | ICD-10-CM

## 2022-12-20 DIAGNOSIS — R296 Repeated falls: Secondary | ICD-10-CM | POA: Diagnosis present

## 2022-12-20 DIAGNOSIS — F419 Anxiety disorder, unspecified: Secondary | ICD-10-CM | POA: Diagnosis present

## 2022-12-20 DIAGNOSIS — I2510 Atherosclerotic heart disease of native coronary artery without angina pectoris: Secondary | ICD-10-CM | POA: Diagnosis present

## 2022-12-20 DIAGNOSIS — Z79899 Other long term (current) drug therapy: Secondary | ICD-10-CM | POA: Diagnosis not present

## 2022-12-20 DIAGNOSIS — Z7982 Long term (current) use of aspirin: Secondary | ICD-10-CM | POA: Diagnosis not present

## 2022-12-20 DIAGNOSIS — Z820 Family history of epilepsy and other diseases of the nervous system: Secondary | ICD-10-CM | POA: Diagnosis not present

## 2022-12-20 DIAGNOSIS — Z8249 Family history of ischemic heart disease and other diseases of the circulatory system: Secondary | ICD-10-CM

## 2022-12-20 DIAGNOSIS — E669 Obesity, unspecified: Secondary | ICD-10-CM | POA: Diagnosis present

## 2022-12-20 DIAGNOSIS — E7849 Other hyperlipidemia: Secondary | ICD-10-CM

## 2022-12-20 DIAGNOSIS — G4709 Other insomnia: Secondary | ICD-10-CM | POA: Diagnosis not present

## 2022-12-20 DIAGNOSIS — Z87891 Personal history of nicotine dependence: Secondary | ICD-10-CM | POA: Diagnosis not present

## 2022-12-20 DIAGNOSIS — Z888 Allergy status to other drugs, medicaments and biological substances status: Secondary | ICD-10-CM | POA: Diagnosis not present

## 2022-12-20 DIAGNOSIS — Z66 Do not resuscitate: Secondary | ICD-10-CM

## 2022-12-20 DIAGNOSIS — Z6832 Body mass index (BMI) 32.0-32.9, adult: Secondary | ICD-10-CM

## 2022-12-20 DIAGNOSIS — D696 Thrombocytopenia, unspecified: Secondary | ICD-10-CM | POA: Diagnosis present

## 2022-12-20 DIAGNOSIS — R001 Bradycardia, unspecified: Secondary | ICD-10-CM | POA: Diagnosis present

## 2022-12-20 LAB — URINALYSIS, ROUTINE W REFLEX MICROSCOPIC
Bacteria, UA: NONE SEEN
Bilirubin Urine: NEGATIVE
Glucose, UA: NEGATIVE mg/dL
Ketones, ur: NEGATIVE mg/dL
Leukocytes,Ua: NEGATIVE
Nitrite: NEGATIVE
Protein, ur: NEGATIVE mg/dL
Specific Gravity, Urine: >1.046 — ABNORMAL HIGH (ref 1.005–1.030)
pH: 5 (ref 5.0–8.0)

## 2022-12-20 LAB — CBC
HCT: 44.7 % (ref 39.0–52.0)
Hemoglobin: 14.9 g/dL (ref 13.0–17.0)
MCH: 29.4 pg (ref 26.0–34.0)
MCHC: 33.3 g/dL (ref 30.0–36.0)
MCV: 88.3 fL (ref 80.0–100.0)
Platelets: 109 10*3/uL — ABNORMAL LOW (ref 150–400)
RBC: 5.06 MIL/uL (ref 4.22–5.81)
RDW: 14.1 % (ref 11.5–15.5)
WBC: 8.5 10*3/uL (ref 4.0–10.5)
nRBC: 0 % (ref 0.0–0.2)

## 2022-12-20 LAB — BASIC METABOLIC PANEL
Anion gap: 10 (ref 5–15)
BUN: 12 mg/dL (ref 8–23)
CO2: 22 mmol/L (ref 22–32)
Calcium: 8.5 mg/dL — ABNORMAL LOW (ref 8.9–10.3)
Chloride: 102 mmol/L (ref 98–111)
Creatinine, Ser: 1.23 mg/dL (ref 0.61–1.24)
GFR, Estimated: >60 mL/min (ref >60)
Glucose, Bld: 106 mg/dL — ABNORMAL HIGH (ref 70–99)
Potassium: 3.6 mmol/L (ref 3.5–5.1)
Sodium: 134 mmol/L — ABNORMAL LOW (ref 135–145)

## 2022-12-20 LAB — TROPONIN I (HIGH SENSITIVITY)
Troponin I (High Sensitivity): 6 ng/L (ref <18)
Troponin I (High Sensitivity): 12 ng/L (ref <18)

## 2022-12-20 LAB — RESP PANEL BY RT-PCR (RSV, FLU A&B, COVID)  RVPGX2
Influenza A by PCR: NEGATIVE
Influenza B by PCR: NEGATIVE
Resp Syncytial Virus by PCR: NEGATIVE
SARS Coronavirus 2 by RT PCR: POSITIVE — AB

## 2022-12-20 LAB — CBG MONITORING, ED: Glucose-Capillary: 110 mg/dL — ABNORMAL HIGH (ref 70–99)

## 2022-12-20 LAB — CK: Total CK: 69 U/L (ref 49–397)

## 2022-12-20 LAB — T4, FREE: Free T4: 0.89 ng/dL (ref 0.61–1.12)

## 2022-12-20 LAB — TSH: TSH: 2.331 u[IU]/mL (ref 0.350–4.500)

## 2022-12-20 MED ORDER — ASPIRIN 81 MG PO TBEC: 81.0000 mg | DELAYED_RELEASE_TABLET | Freq: Every day | ORAL | Status: DC

## 2022-12-20 MED ORDER — ONDANSETRON HCL 4 MG/2ML IJ SOLN: 4.0000 mg | Freq: Four times a day (QID) | INTRAMUSCULAR | Status: DC | PRN

## 2022-12-20 MED ORDER — ONDANSETRON HCL 4 MG PO TABS: 4.0000 mg | ORAL_TABLET | Freq: Four times a day (QID) | ORAL | Status: DC | PRN

## 2022-12-20 MED ORDER — ACETAMINOPHEN 650 MG RE SUPP: 650.0000 mg | Freq: Four times a day (QID) | RECTAL | Status: DC | PRN

## 2022-12-20 MED ORDER — LORAZEPAM 2 MG/ML IJ SOLN: 0.5000 mg | Freq: Once | INTRAMUSCULAR | Status: DC | PRN

## 2022-12-20 MED ORDER — OLANZAPINE 5 MG PO TABS
5.0000 mg | ORAL_TABLET | Freq: Every day | ORAL | Status: DC
  Administered 2022-12-20 – 2022-12-23 (×4): 5 mg via ORAL
  Filled 2022-12-20 (×4): qty 1

## 2022-12-20 MED ORDER — VITAMIN B-12 1000 MCG PO TABS
1000.0000 ug | ORAL_TABLET | Freq: Every day | ORAL | Status: DC
  Administered 2022-12-21 – 2022-12-24 (×4): 1000 ug via ORAL
  Filled 2022-12-20 (×4): qty 1

## 2022-12-20 MED ORDER — ACETAMINOPHEN 325 MG PO TABS
650.0000 mg | ORAL_TABLET | Freq: Four times a day (QID) | ORAL | Status: DC | PRN
  Administered 2022-12-20: 650 mg via ORAL
  Filled 2022-12-20: qty 2

## 2022-12-20 MED ORDER — TRAZODONE HCL 100 MG PO TABS
200.0000 mg | ORAL_TABLET | Freq: Every day | ORAL | Status: DC
  Administered 2022-12-20 – 2022-12-23 (×4): 200 mg via ORAL
  Filled 2022-12-20 (×4): qty 2

## 2022-12-20 MED ORDER — LORATADINE 10 MG PO TABS
10.0000 mg | ORAL_TABLET | Freq: Every day | ORAL | Status: DC
  Administered 2022-12-21 – 2022-12-24 (×4): 10 mg via ORAL
  Filled 2022-12-20 (×4): qty 1

## 2022-12-20 MED ORDER — LACTATED RINGERS IV BOLUS
1000.0000 mL | Freq: Once | INTRAVENOUS | Status: AC
  Administered 2022-12-20: 1000 mL via INTRAVENOUS

## 2022-12-20 MED ORDER — GADOBUTROL 1 MMOL/ML IV SOLN
10.0000 mL | Freq: Once | INTRAVENOUS | Status: AC | PRN
  Administered 2022-12-20: 10 mL via INTRAVENOUS

## 2022-12-20 MED ORDER — CLONAZEPAM 0.5 MG PO TABS: 0.5000 mg | ORAL_TABLET | Freq: Four times a day (QID) | ORAL | Status: DC | PRN

## 2022-12-20 MED ORDER — LISINOPRIL 10 MG PO TABS
10.0000 mg | ORAL_TABLET | Freq: Every day | ORAL | Status: DC
  Administered 2022-12-21 – 2022-12-24 (×4): 10 mg via ORAL
  Filled 2022-12-20 (×4): qty 1

## 2022-12-20 MED ORDER — ENOXAPARIN SODIUM 60 MG/0.6ML IJ SOSY
0.5000 mg/kg | PREFILLED_SYRINGE | INTRAMUSCULAR | Status: DC
  Administered 2022-12-20 – 2022-12-23 (×4): 55 mg via SUBCUTANEOUS
  Filled 2022-12-20 (×4): qty 0.6

## 2022-12-20 MED ORDER — PANTOPRAZOLE SODIUM 40 MG PO TBEC
40.0000 mg | DELAYED_RELEASE_TABLET | Freq: Every day | ORAL | Status: DC
  Administered 2022-12-20 – 2022-12-24 (×5): 40 mg via ORAL
  Filled 2022-12-20 (×5): qty 1

## 2022-12-20 MED ORDER — IOHEXOL 350 MG/ML SOLN
100.0000 mL | Freq: Once | INTRAVENOUS | Status: AC | PRN
  Administered 2022-12-20: 100 mL via INTRAVENOUS

## 2022-12-20 NOTE — Assessment & Plan Note (Deleted)
CODE STATUS discussed and patient wishes to be a DO NOT RESUSCITATE.

## 2022-12-20 NOTE — ED Provider Notes (Addendum)
[facility] Provider Note    Event Date/Time   First MD Initiated Contact with Patient 12/20/22 1459     (approximate)   History   Weakness   HPI  Alejandro Wall is a 67 y.o. male  with history of ITP, stroke, HTN who comes in with weakness. Pt reports weakness since last night. Pt was checking mail when he fell and was found passed out by car. + hit head on ground.  Pt HR elevated and given 500 CC bolus fluids..  Patient reports he never lost consciousness that he was just too weak to even stand up.  He reports the symptoms have been going on since yesterday but worsening.  He denies any chest pain, shortness of breath, abdominal pain just feels generalized weakness.  He denies one side being weaker than the other.      Physical Exam   Triage Vital Signs: ED Triage Vitals  Enc Vitals Group     BP 12/20/22 1342 116/86     Pulse Rate 12/20/22 1338 (!) 128     Resp 12/20/22 1338 18     Temp 12/20/22 1338 98.9 F (37.2 C)     Temp Source 12/20/22 1338 Oral     SpO2 12/20/22 1338 95 %     Weight --      Height --      Head Circumference --      Peak Flow --      Pain Score 12/20/22 1340 0     Pain Loc --      Pain Edu? --      Excl. in [place]? --     Most recent vital signs: Vitals:   12/20/22 1338 12/20/22 1342  BP:  116/86  Pulse: (!) 128   Resp: 18   Temp: 98.9 F (37.2 C)   SpO2: 95%      General: Awake, no distress.  CV:  Good peripheral perfusion.  Tachycardic Resp:  Normal effort.  Abd:  No distention.  Soft nontender Other:  Cranial nerves appear intact.  Equal strength in arms and legs.   ED Results / Procedures / Treatments   Labs (all labs ordered are listed, but only abnormal results are displayed) Labs Reviewed  BASIC METABOLIC PANEL - Abnormal; Notable for the following components:      Result Value   Sodium 134 (*)    Glucose, Bld 106 (*)    Calcium 8.5 (*)    All other components within normal limits  CBC -  Abnormal; Notable for the following components:   Platelets 109 (*)    All other components within normal limits  CBG MONITORING, ED - Abnormal; Notable for the following components:   Glucose-Capillary 110 (*)    All other components within normal limits  URINALYSIS, ROUTINE W REFLEX MICROSCOPIC     EKG  My interpretation of EKG:  Sinus tachy rate of 129, no st elevation, no twi, normal intervals  RADIOLOGY I have reviewed the CT head personally and interpretted and no ICH    PROCEDURES:  Critical Care performed: No  .1-3 Lead EKG Interpretation  Performed by: Vanessa Springer, MD Authorized by: Vanessa Lago, MD     Interpretation: abnormal     ECG rate:  120   ECG rate assessment: tachycardic     Rhythm: sinus tachycardia     Ectopy: none     Conduction: normal      MEDICATIONS ORDERED IN ED: Medications  lactated ringers bolus 1,000 mL (1,000 mLs Intravenous New Bag/Given 12/20/22 1604)  iohexol (OMNIPAQUE) 350 MG/ML injection 100 mL (100 mLs Intravenous Contrast Given 12/20/22 1529)     IMPRESSION / MDM / ASSESSMENT AND PLAN / ED COURSE  I reviewed the triage vital signs and the nursing notes.   Patient's presentation is most consistent with acute presentation with potential threat to life or bodily function.   Patient comes in with weakness, tachycardia.  When I went into the room patient found down on the ground stating that he was trying to shift himself in bed when he then fell down.  Did hit his head again so repeat CT head ordered evaluate for intercranial hemorrhage CT PE given continued tachycardia and evaluate for PE and CT abdomen to evaluate for any acute abdominal process.  CBC normal BMP normal Glucose Normal Trop negative  3. Multilevel degenerative change, including probably severe right  foraminal stenosis C5-C6. MRI of the cervical spine could further  characterize if clinically warranted.  Patient has good strength in both of his arms and  legs there is not one side weaker than the other and he is global weakness do not feel like MRI is warranted currently but I did discuss this with patient.  Patient's COVID test is positive.  Suspect this is causing his weakness and tachycardia.  Troponin negative doubt myocarditis.  CT imaging overall reassuring but there is concerns for some decompressed colon and patient understands that he should follow-up outpatient for colonoscopy.  At this time I do not feel the patient is safe to go home given multiple falls now.  Will discuss with the hospital team for admission.   The patient is on the cardiac monitor to evaluate for evidence of arrhythmia and/or significant heart rate changes.      FINAL CLINICAL IMPRESSION(S) / ED DIAGNOSES   Final diagnoses:  COVID-19  Weakness     Rx / DC Orders   ED Discharge Orders     None        Note:  This document was prepared using Dragon voice recognition software and may include unintentional dictation errors.   Vanessa Warren, MD 12/20/22 1715    Vanessa Bulpitt, MD 12/20/22 [phone]

## 2022-12-20 NOTE — H&P (Signed)
History and Physical    Patient: Alejandro Wall DTO:[id number] DOB: [birth year] DOA: 12/20/2022 DOS: the patient was seen and examined on 12/20/2022 PCP: Rusty Aus, MD  Patient coming from: Home  Chief Complaint:  Chief Complaint  Patient presents with   Weakness   HPI: Alejandro Wall is a 67 y.o. male with medical history significant of cardiovascular disease, hypertension, hyperlipidemia, anxiety depression and insomnia.  He states for the past month he has been very weak.  He states that his legs and arms have been giving out on him.  It has been intermittent.  Had a couple falls at home.  1 here in the emergency room.  Had a runny nose and sore throat the other day.  Diagnosed with COVID infection here in the hospital.  He states he has been having some fever and chills.  Complains of generalized weakness especially in his arms.  He has been having some joint and muscle pains.  Hospital services were contacted for further admission with COVID infection and weakness. Review of Systems: Review of Systems  Constitutional:  Positive for fever, malaise/fatigue and weight loss. Negative for chills.  HENT:  Positive for sore throat. Negative for hearing loss.   Eyes:  Negative for blurred vision and double vision.  Respiratory:  Positive for cough and wheezing.   Cardiovascular:  Negative for chest pain.  Gastrointestinal:  Negative for abdominal pain, constipation, diarrhea, nausea and vomiting.  Genitourinary:  Negative for dysuria.  Musculoskeletal:  Positive for joint pain and myalgias.  Skin:  Negative for itching and rash.  Neurological:  Positive for focal weakness. Negative for dizziness.  Endo/Heme/Allergies:  Does not bruise/bleed easily.  Psychiatric/Behavioral:  Positive for depression. The patient is nervous/anxious and has insomnia.     Past Medical History:  Diagnosis Date   Bradycardia    Cancer ([place])    Hyperlipidemia    Hypertension    Stroke ([place])    TIA (transient  ischemic attack)    Tobacco abuse    Past Surgical History:  Procedure Laterality Date   FINGER SURGERY     HERNIA REPAIR     x 2   VARICOSE VEIN SURGERY     Social History:  reports that he has quit smoking. His smoking use included cigarettes. He has a 20.00 pack-year smoking history. He does not have any smokeless tobacco history on file. He reports that he does not drink alcohol and does not use drugs.  No Known Allergies  Family History  Problem Relation Age of Onset   Heart failure Mother    Diabetes Mother    Dementia Mother    Cirrhosis Mother    Atrial fibrillation Mother    COPD Father     Prior to Admission medications   Medication Sig Start Date End Date Taking? Authorizing Provider  lisinopril (ZESTRIL) 10 MG tablet Take 10 mg by mouth daily.   Yes [provider]  traZODone (DESYREL) 100 MG tablet Take 200 mg by mouth at bedtime. 12/08/22  Yes [provider]  aspirin 81 MG tablet Take 81 mg by mouth daily.    [provider]  clonazePAM (KLONOPIN) 0.5 MG tablet Take 1 mg by mouth 2 (two) times daily.    [provider]  OLANZapine (ZYPREXA) 5 MG tablet Take 5 mg by mouth at bedtime.    [provider]  omeprazole (PRILOSEC) 40 MG capsule Take 40 mg by mouth daily.    [provider]  rosuvastatin (CRESTOR)  5 MG tablet Take 5 mg by mouth daily.    [provider]    Physical Exam: Vitals:   12/20/22 1615 12/20/22 1630 12/20/22 1700 12/20/22 1730  BP: 133/82 125/83 (!) 135/92 132/81  Pulse: (!) 109 (!) 110 (!) 110 (!) 107  Resp: 16     Temp:      TempSrc:      SpO2: 95% 92% 93% 93%  Weight:      Height:       Physical Exam HENT:     Head: Normocephalic.     Mouth/Throat:     Pharynx: No oropharyngeal exudate.  Eyes:     General: Lids are normal.     Conjunctiva/sclera: Conjunctivae normal.  Cardiovascular:     Rate and Rhythm: Normal rate and regular rhythm.     Heart sounds: Normal  heart sounds, S1 normal and S2 normal.  Pulmonary:     Breath sounds: No decreased breath sounds, wheezing, rhonchi or rales.  Abdominal:     Palpations: Abdomen is soft.     Tenderness: There is no abdominal tenderness.  Musculoskeletal:     Right lower leg: Swelling present.     Left lower leg: Swelling present.  Skin:    General: Skin is warm.     Findings: No rash.  Neurological:     Mental Status: He is alert and oriented to person, place, and time.     Comments: Power 4+ out of 5 bilateral upper and lower extremities.     Data Reviewed: 2 CT scans of the head were negative Cervical spine CT scan showed multilevel degenerative disease including probably severe right foraminal stenosis at C5-C6.  MRI of the cervical spine could further characterize the if warranted. CT angio of the chest showed groundglass and right and left apices likely infectious inflammatory etiology, clustered nodularity within the lingula measuring 6 mm could also be infectious/noninflammatory noncontrast CT scan in 6 to 12 months recommended CT abdomen pelvis showed no acute findings in the abdomen pelvis.  2 areas of focal wall thickening which could be due to focally decompressed colon 1 in the sigmoid and another in the rectum.  Can correlate with outpatient colonoscopy.  Sodium 134, creatinine 1.23, platelet count 109, COVID test positive, urine analysis negative, TSH normal range, CK 69  Assessment and Plan: * COVID-19 virus infection Supportive care.  PT and OT evaluations.  Check CRP in the morning.  Bilateral arm weakness Will obtain an MRI of the cervical spine.  Check acetylcholinesterase antibodies.  PT and OT evaluation  Bilateral leg weakness Obtain MRI of the cervical spine.  Check acetylcholinesterase antibodies.  PT and OT evaluation.  Pulmonary nodule Will need repeat CT scan in 6 to 12 months.  Could be related to COVID infection.  Hyponatremia Sodium only 1 point less than the  normal range.  Thrombocytopenia ([place]) Likely secondary to COVID infection  Hyperlipidemia, unspecified Hold Crestor and do a statin holiday.  Essential hypertension Continue lisinopril  GERD (gastroesophageal reflux disease) Continue PPI  Anxiety and depression Continue clonazepam and trazodone  Insomnia Patient on Zyprexa and trazodone at night  DNR (do not resuscitate) CODE STATUS discussed and patient wishes to be a DO NOT RESUSCITATE.   Recommend outpatient colonoscopy if has not had 1 already.   Advance Care Planning:   Code Status: DNR   Consults: none  Family Communication: declined  Severity of Illness: The appropriate patient status for this patient is INPATIENT. Inpatient status is  judged to be reasonable and necessary in order to provide the required intensity of service to ensure the patient's safety. The patient's presenting symptoms, physical exam findings, and initial radiographic and laboratory data in the context of their chronic comorbidities is felt to place them at high risk for further clinical deterioration. Furthermore, it is not anticipated that the patient will be medically stable for discharge from the hospital within 2 midnights of admission.   * I certify that at the point of admission it is my clinical judgment that the patient will require inpatient hospital care spanning beyond 2 midnights from the point of admission due to high intensity of service, high risk for further deterioration and high frequency of surveillance required.*  Author: Loletha Grayer, MD 12/20/2022 6:30 PM  For on call review [email or web address].

## 2022-12-20 NOTE — ED Triage Notes (Addendum)
Pt to ED via [facility] from home. Pt reports weakness since last night. Pt states went to check his mail today and fell and was found by a car passing by. Pt did hit his head on the ground. 18g LAC. 500cc NS bolus. Pt denies pain. EMS did not obtain CBG. Pt hx CVA. Pt reports baseline difficulty finding words and remembering things from prior stroke.   EMS VS:  HR 130 99.3 oral  BP 124/83 O2 97% on RA

## 2022-12-20 NOTE — ED Notes (Signed)
Pt gone to MRI

## 2022-12-20 NOTE — ED Notes (Signed)
Dr Jari Pigg was going into pt room about 5 minutes ago and pt was found down on floor. Other staff recruited to lift pt back to bed. Pt did not lose consciousness but did hit head again. Pt denies blood thinners other than aspirin. Pt states he has been getting weak and falling and this time was similar, he could not stop himself from falling out of bed when he started. Is alert, oriented with no obvious injuries or neuro deficits. Placed on cardiac monitor.

## 2022-12-20 NOTE — Assessment & Plan Note (Addendum)
Supportive care.  Switch Decadron to oral taper upon going home.  PT and OT recommending home health.

## 2022-12-20 NOTE — ED Notes (Signed)
Report given to Bianca, RN.  

## 2022-12-20 NOTE — Assessment & Plan Note (Signed)
Continue lisinopril

## 2022-12-20 NOTE — Assessment & Plan Note (Addendum)
MRI of the lumbar spine does not explain his symptoms.  Strength slowly improving.  I suspect secondary to Crestor.  Continue statin holiday.

## 2022-12-20 NOTE — Assessment & Plan Note (Addendum)
Looks chronic looking back at old labs.  Hepatitis C antibody negative.

## 2022-12-20 NOTE — Assessment & Plan Note (Addendum)
Hold Crestor and do a statin holiday.  Will continue to hold at this point since the patient has improved.

## 2022-12-20 NOTE — Assessment & Plan Note (Addendum)
MRI cervical spine does not explain his symptoms.  Slowly improving with physical therapy and Occupational Therapy.  Acetylcholinesterase antibody negative.  I suspect this is secondary to the Crestor.  Continue statin holiday.

## 2022-12-20 NOTE — Assessment & Plan Note (Signed)
Continue PPI

## 2022-12-20 NOTE — ED Notes (Signed)
Called to give sister an update per pt's request. Sister's contact information listed below. Lesly Rubenstein (Sister)  [phone] [facility])  [phone] (landline)

## 2022-12-20 NOTE — Assessment & Plan Note (Signed)
Will need repeat CT scan in 6 to 12 months.  Could be related to COVID infection.

## 2022-12-20 NOTE — Assessment & Plan Note (Signed)
Continue clonazepam and trazodone.   

## 2022-12-20 NOTE — Assessment & Plan Note (Signed)
Patient on Zyprexa and trazodone at night

## 2022-12-20 NOTE — Assessment & Plan Note (Addendum)
Sodium only 1 point less than the normal range on presentation now in the normal range.

## 2022-12-21 ENCOUNTER — Inpatient Hospital Stay: Payer: Medicare Other

## 2022-12-21 ENCOUNTER — Encounter: Payer: Self-pay | Admitting: Internal Medicine

## 2022-12-21 DIAGNOSIS — M5440 Lumbago with sciatica, unspecified side: Secondary | ICD-10-CM

## 2022-12-21 DIAGNOSIS — E871 Hypo-osmolality and hyponatremia: Secondary | ICD-10-CM | POA: Diagnosis not present

## 2022-12-21 DIAGNOSIS — M4802 Spinal stenosis, cervical region: Secondary | ICD-10-CM

## 2022-12-21 DIAGNOSIS — U071 COVID-19: Secondary | ICD-10-CM | POA: Diagnosis not present

## 2022-12-21 DIAGNOSIS — R531 Weakness: Secondary | ICD-10-CM

## 2022-12-21 DIAGNOSIS — R29898 Other symptoms and signs involving the musculoskeletal system: Secondary | ICD-10-CM | POA: Diagnosis not present

## 2022-12-21 DIAGNOSIS — R911 Solitary pulmonary nodule: Secondary | ICD-10-CM | POA: Diagnosis not present

## 2022-12-21 DIAGNOSIS — G4700 Insomnia, unspecified: Secondary | ICD-10-CM

## 2022-12-21 LAB — CBC
HCT: 41.9 % (ref 39.0–52.0)
Hemoglobin: 14.2 g/dL (ref 13.0–17.0)
MCH: 29.7 pg (ref 26.0–34.0)
MCHC: 33.9 g/dL (ref 30.0–36.0)
MCV: 87.7 fL (ref 80.0–100.0)
Platelets: 101 10*3/uL — ABNORMAL LOW (ref 150–400)
RBC: 4.78 MIL/uL (ref 4.22–5.81)
RDW: 14.1 % (ref 11.5–15.5)
WBC: 7.4 10*3/uL (ref 4.0–10.5)
nRBC: 0 % (ref 0.0–0.2)

## 2022-12-21 LAB — COMPREHENSIVE METABOLIC PANEL
ALT: 15 U/L (ref 0–44)
AST: 22 U/L (ref 15–41)
Albumin: 3.6 g/dL (ref 3.5–5.0)
Alkaline Phosphatase: 53 U/L (ref 38–126)
Anion gap: 9 (ref 5–15)
BUN: 13 mg/dL (ref 8–23)
CO2: 24 mmol/L (ref 22–32)
Calcium: 8.6 mg/dL — ABNORMAL LOW (ref 8.9–10.3)
Chloride: 104 mmol/L (ref 98–111)
Creatinine, Ser: 1.12 mg/dL (ref 0.61–1.24)
GFR, Estimated: >60 mL/min (ref >60)
Glucose, Bld: 92 mg/dL (ref 70–99)
Potassium: 3.7 mmol/L (ref 3.5–5.1)
Sodium: 137 mmol/L (ref 135–145)
Total Bilirubin: 1.2 mg/dL (ref 0.3–1.2)
Total Protein: 7.1 g/dL (ref 6.5–8.1)

## 2022-12-21 LAB — HIV ANTIBODY (ROUTINE TESTING W REFLEX): HIV Screen 4th Generation wRfx: NONREACTIVE

## 2022-12-21 LAB — C-REACTIVE PROTEIN: CRP: 7.6 mg/dL — ABNORMAL HIGH (ref <1.0)

## 2022-12-21 MED ORDER — DEXAMETHASONE SODIUM PHOSPHATE 10 MG/ML IJ SOLN
6.0000 mg | INTRAMUSCULAR | Status: DC
  Administered 2022-12-22 – 2022-12-23 (×2): 6 mg via INTRAVENOUS
  Filled 2022-12-21 (×2): qty 0.6

## 2022-12-21 MED ORDER — DEXAMETHASONE SODIUM PHOSPHATE 10 MG/ML IJ SOLN
10.0000 mg | Freq: Two times a day (BID) | INTRAMUSCULAR | Status: DC
  Administered 2022-12-21: 10 mg via INTRAVENOUS
  Filled 2022-12-21: qty 1

## 2022-12-21 NOTE — Consult Note (Signed)
Neurology Consultation Reason for Consult: Frequent falls Requesting Physician: Loletha Grayer  CC: Generalized weakness and frequent falls   History is obtained from:   HPI: Alejandro Wall is a 67 y.o. male with a past medical history significant for hypertension, hyperlipidemia, possible TIA   (March 2016), bradycardia, tobacco use, depression with severe paranoia requiring antipsychotic agents  He reports that for the past 2 or 3 months he has been having increasing falls and episodic generalized weakness.  He reports the first event he remembers he was watching TV at night when he felt like he couldn't get up off the couch.  He attempted to get up but was too weak in both the legs and subsequently laid there for 2 hours prior to attempting again at which time he could make it to bed. No further symptoms on awakening. Similar symptoms with increasing frequency. He does feel like weakness typically involves legs > arms but intermittently arms are severely affected as well. No nausea, vomiting, lightheadedness, vision change (black/white/tunnel/double), diaphoresis, changes in heart rate, shortness of breath with these episodes.  However typically he does have some sort of warning about these episodes, typically the tingling in his legs.  Event that led to his presentation 1/17 he was getting mail out of the mailbox when he suddenly got weak without any warning, staggered into neighbor's yard and fell, asked a passing good [employer]aritan to call 911 and was brought to the ED. Vitals on arrival were notable for heart rate of 128, blood pressure 116/86, respirations 16, 95% on room air  Also fell out of bed in ED, unwitnessed, not on cardiac event monitor.  "Dr Jari Pigg was going into pt room about 5 minutes ago and pt was found down on floor. Other staff recruited to lift pt back to bed. Pt did not lose consciousness but did hit head again. Pt denies blood thinners other than aspirin. Pt states he has been  getting weak and falling and this time was similar, he could not stop himself from falling out of bed when he started. 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   No jaw claudication, temporal tenderness  No bulbar symptoms  Driving, living independently, and is continent w/ no bowel or bladder sx or saddle anesthesia  On review of 02/2015 records, presented with generalized weakness > 24 hours R > L but MRI brain was negative (chronic microvascular disease, patient reports that this was interpreted to him as that he had had  a stroke/TIA). However he was found to have significant bradycardia to the 30s to 50s; with an event to 39 (at 5:10 AM) on subsequent 30 day monitor but he reports he was discharged from cardiac follow-up and has not had any particular follow-up for this  08/04/2021 PCP note: "Patient has been having a fair amount of paranoia, thinking people are talking about him. Still having issues with his neighbors staring at him. He is going to his original church but not getting involved. No suicidal thoughts... Progressive paranoia- intolerant to Abilify historically, start Seroquel 25 mg at  bedtime with 100 mg trazodone. If the Seroquel is sufficient for sleep will come off the trazodone 3-week reevaluation. A lot of issues regarding his neighbors and even the stores that he goes to such as [employer]" there is also some mention of dementia in this note  ROS: All other review of systems was negative except as noted in the HPI.   Past Medical History:  Diagnosis Date   Bradycardia    Cancer  (HCC)    Hyperlipidemia    Hypertension    Stroke ([place])    TIA (transient ischemic attack)    Tobacco abuse    Past Surgical History:  Procedure Laterality Date   FINGER SURGERY     HERNIA REPAIR     x 2   VARICOSE VEIN SURGERY     Current Outpatient Medications  Medication Instructions   aspirin 81 mg, Oral, Daily   cetirizine (ZYRTEC) 10 mg, Oral, Daily   clonazePAM (KLONOPIN) 1 mg, Oral, 2 times daily   cyanocobalamin (VITAMIN B12) 1,000 mcg, Oral, Daily   lisinopril (ZESTRIL) 10 mg, Oral, Daily   OLANZapine (ZYPREXA) 5 mg, Oral, Daily at bedtime   omeprazole (PRILOSEC) 40 mg, Oral, Daily   rosuvastatin (CRESTOR) 5 mg, Oral, Daily   traZODone (DESYREL) 200 mg, Oral, Daily at bedtime    Family History  Problem Relation Age of Onset   Heart failure Mother    Diabetes Mother    Dementia Mother    Cirrhosis Mother    Atrial fibrillation Mother    COPD Father     Social History:  reports that he has quit smoking. His smoking use included cigarettes. He has a 20.00 pack-year smoking history. He does not have any smokeless tobacco history on file. He reports that he does not drink alcohol and does not use drugs.  Exam: Current vital signs: BP 116/81 (BP Location: Right Arm)   Pulse (!) 118   Temp 100 F (37.8 C)   Resp 16   Ht 6' (1.829 m)   Wt 108.9 kg   SpO2 94%   BMI 32.55 kg/m  Vital signs in last 24 hours: Temp:  [98.4 F (36.9 C)-100.3 F (37.9 C)] 100 F (37.8 C) (01/18 1027) Pulse Rate:  [95-128] 118 (01/18 1027) Resp:  [16-24] 16 (01/18 1027) BP: (92-141)/(65-94) 116/81 (01/18 1027) SpO2:  [91 %-100 %] 94 % (01/18 1027) Weight:  [108.9 kg] 108.9 kg (01/17 1609)   Physical Exam  Constitutional: Appears well-developed and well-nourished, moderately ill.  Psych: Affect appropriate to situation, calm and cooperative Eyes: No scleral injection HENT: No oropharyngeal obstruction.  MSK: no joint deformities.  Cardiovascular: Tachycardic Respiratory:  Effort normal, non-labored breathing GI: Soft.  No distension but protuberant. There is no tenderness.  Skin: Scattered bruises on bilateral knees  Neuro: Mental Status: Patient is awake, alert, oriented to person, place, month, year, and situation. Patient is able to give a clear and coherent history. No signs of aphasia or neglect Cranial Nerves: II: Visual Fields are full. Pupils are equal, round, and reactive to light.   III,IV, VI: EOMI without ptosis or diploplia.  V: Facial sensation is symmetric to temperature VII: Facial movement is symmetric.  VIII: hearing is intact to voice X: Uvula elevates symmetrically XI: Shoulder shrug is symmetric. XII: tongue is midline without atrophy or fasciculations.  Motor: In the upper extremities he has some giveaway weakness throughout but especially in the deltoids which are pain limited; did not improve substantially with coaching In the  lower extremities he initially had some giveaway weakness of hip flexion but with coaching can give 5/5 strength throughout the lower extremities Mild pronation of the left upper extremity without drift Sensory: Intact to light touch throughout Reflexes: 3+ and symmetric in the brachioradialis and biceps with negative Hoffmann's, 2+ patellae and Achilles with downgoing toes bilaterally Cerebellar: FNF and HKS are intact bilaterally Gait:  Deferred for safety   I have reviewed labs in epic and the results pertinent to this consultation are:  COVID +  CK normal   Basic Metabolic Panel: Recent Labs  Lab 12/20/22 1345 12/21/22 0424  NA 134* 137  K 3.6 3.7  CL 102 104  CO2 22 24  GLUCOSE 106* 92  BUN 12 13  CREATININE 1.23 1.12  CALCIUM 8.5* 8.6*    CBC: Recent Labs  Lab 12/20/22 1345 12/21/22 0424  WBC 8.5 7.4  HGB 14.9 14.2  HCT 44.7 41.9  MCV 88.3 87.7  PLT 109* 101*   Chronic thrombocytopenia  Coagulation Studies: No results for input(s): "LABPROT", "INR" in the last 72  hours.    I have reviewed the images obtained:  CT head x 2 personally reviewed, agree with radiology no acute intracranial process  MRI C-spine personally reviewed, agree with radiology:   Multilevel degenerative changes resulting in mild spinal canal stenosis C4-C5 through C6-C7 and multilevel moderate and severe neural foraminal stenosis as detailed above.  CTA CHEST:   No evidence of pulmonary embolism to the level of the proximal segmental level. Respiratory motion artifact distorts the distal segmental and subsegmental vessels.   Ground-glass in the right and left apices, likely infectious/inflammatory.   Clustered nodularity in the lingula, largest measuring up to 6 mm, could also be infectious/inflammatory. Non-contrast chest CT at 6-12 months is recommended. If the nodule is stable at time of repeat CT, then future CT at 18-24 months (from today's scan) is considered optional for low-risk patients, but is recommended for high-risk patients. This recommendation follows the consensus statement: Guidelines for Management of Incidental Pulmonary Nodules Detected on CT Images: From the Fleischner Society 2017; Radiology 2017; 284:228-243.   CT ABDOMEN AND PELVIS:   No acute findings in the abdomen or pelvis.   Two areas of focal wall thickening which could be due to focally decompressed colon, one in the sigmoid colon and another in the rectum. If not recently performed, recommend correlation with outpatient colonoscopy.   Impression: Intermittent generalized weakness in a patient with a similar presentation in the past (although at that time he had focal right greater than left-sided weakness, at which time workup was notable only for significant bradycardia).  Given the description of the events I have very low concern for primary neurological etiology such as seizures.  Would not expect stroke to have intermittent symptoms like this and head CT x 2 is reassuring  against any acute intracranial process.  Given the symptoms are often preceded by low back pain, I wonder if there may be a component of vagal response triggering bradycardia.  Certainly weakness etc. may be exacerbated in the setting of his current COVID-19 infection  Recommendations: -MRI lumbar spine -Cardiac event monitor -If these studies are reassuring and patient has no further episodes, close outpatient neurology follow-up is appropriate and inpatient neurology will be available as needed -Appreciate primary team management of COVID-19 and comorbidities  Lesleigh Noe MD-PhD [facility] [phone] [facility] coverage for [facility] is from 8 AM to 4 AM in-house and 4 PM to 8  PM by telephone/video. 8 PM to 8 AM emergent questions or overnight urgent questions should be addressed to Teleneurology On-call or [facility] neurohospitalist; contact information can be found on AMION

## 2022-12-21 NOTE — Plan of Care (Signed)

## 2022-12-21 NOTE — Progress Notes (Signed)
Progress Note   Patient: Alejandro Wall OIZ:[id number] DOB: 09-27-1956 DOA: 12/20/2022     1 DOS: the patient was seen and examined on 12/21/2022   Brief hospital course: 67 y.o. male with medical history significant of cardiovascular disease, hypertension, hyperlipidemia, anxiety depression and insomnia.  He states for the past month he has been very weak.  He states that his legs and arms have been giving out on him.  It has been intermittent.  Had a couple falls at home.  1 here in the emergency room.  Had a runny nose and sore throat the other day.  Diagnosed with COVID infection here in the hospital.  He states he has been having some fever and chills.  Complains of generalized weakness especially in his arms.  He has been having some joint and muscle pains.  Hospital services were contacted for further admission with COVID infection and weaknes   MRI cervical spine showed multilevel degenerative changes with resulting mild spinal canal stenosis C4-C5 through C6-C7 and multilevel moderate and severe neuroforaminal foraminal stenosis.  Neurosurgery does not believe that this is causing his bilateral arm and leg weakness and recommended neurology consultation.    Assessment and Plan: * COVID-19 virus infection Supportive care.  PT and OT evaluations.  With elevated CRP Decadron started.  Patient also having low-grade temperature.  Bilateral arm weakness Neurosurgery does not believe that the MRI findings would be causing his symptoms.  Neurology consultation.  Check acetylcholinesterase antibodies.  PT and OT recommending rehab  Bilateral leg weakness Neurosurgery does not believe that the MRI findings would cause his symptoms.  Neurology consultation..  Check acetylcholinesterase antibodies.  PT and OT recommending rehab  Pulmonary nodule Will need repeat CT scan in 6 to 12 months.  Could be related to COVID infection.  Hyponatremia Sodium only 1 point less than the normal range on  presentation now in the normal range.  Thrombocytopenia ([place]) Looks chronic looking back at old labs.  Will check a hepatitis C tomorrow.  Hyperlipidemia, unspecified Hold Crestor and do a statin holiday.  Essential hypertension Continue lisinopril  GERD (gastroesophageal reflux disease) Continue PPI  Anxiety and depression Continue clonazepam and trazodone  Insomnia Patient on Zyprexa and trazodone at night        Subjective: Patient still feeling weak with his arms and legs.  Patient does have some cervical radiculopathy on MRI but neurosurgery did not believe that this would be the full cause of his weakness arms and legs and recommended neurology consultation.  Physical Exam: Vitals:   12/21/22 0417 12/21/22 0850 12/21/22 0900 12/21/22 1027  BP: 139/86   116/81  Pulse: (!) 103 (!) 117 (!) 102 (!) 118  Resp: 20   16  Temp: 100 F (37.8 C)   100 F (37.8 C)  TempSrc:      SpO2: 95% 95%  94%  Weight:      Height:       Physical Exam HENT:     Head: Normocephalic.     Mouth/Throat:     Pharynx: No oropharyngeal exudate.  Eyes:     General: Lids are normal.     Conjunctiva/sclera: Conjunctivae normal.  Cardiovascular:     Rate and Rhythm: Normal rate and regular rhythm.     Heart sounds: Normal heart sounds, S1 normal and S2 normal.  Pulmonary:     Breath sounds: Normal breath sounds. No decreased breath sounds, wheezing, rhonchi or rales.  Abdominal:     Palpations: Abdomen is  soft.     Tenderness: There is no abdominal tenderness.  Musculoskeletal:     Right lower leg: No swelling.     Left lower leg: No swelling.  Skin:    General: Skin is warm.     Findings: No rash.  Neurological:     Mental Status: He is alert and oriented to person, place, and time.     Comments: Babinski negative bilaterally.  Power 4+ out of 5 bilateral upper and lower extremities.     Data Reviewed: Cervical spine MRI showed multilevel degenerative changes resulting in  mild spinal canal stenosis C4-C5 through C6-C7 and multilevel moderate and severe neuroforaminal foraminal stenosis. CRP elevated at 7.6, platelet count 101, creatinine 1.12  Family Communication: Spoke with sister on phone  Disposition: Status is: Inpatient Remains inpatient appropriate because: Patient very weak and physical therapy and Occupational Therapy recommending rehab.  Neurology consultation today  Planned Discharge Destination: Rehab    Time spent: 29 minutes Case discussed with neurosurgery and neurology.  Author: Loletha Grayer, MD 12/21/2022 1:16 PM  For on call review [email or web address].

## 2022-12-21 NOTE — Hospital Course (Addendum)
67 y.o. male with medical history significant of cardiovascular disease, hypertension, hyperlipidemia, anxiety depression and insomnia.  He states for the past month he has been very weak.  He states that his legs and arms have been giving out on him.  It has been intermittent.  Had a couple falls at home.  1 here in the emergency room.  Had a runny nose and sore throat the other day.  Diagnosed with COVID infection here in the hospital.  He states he has been having some fever and chills.  Complains of generalized weakness especially in his arms.  He has been having some joint and muscle pains.  Hospital services were contacted for further admission with COVID infection and weaknes   MRI cervical spine showed multilevel degenerative changes with resulting mild spinal canal stenosis C4-C5 through C6-C7 and multilevel moderate and severe neuroforaminal foraminal stenosis.  Neurosurgery does not believe that this is causing his bilateral arm and leg weakness and recommended neurology consultation.  Neurology saw and ordered an MRI of the lumbar spine which showed lumbar spondylosis and degenerative disc disease causing mild impingement at L4-L5 and again unlikely to cause the patient's symptoms.  1/19.  Patient feeling stronger and his appetite coming back.  Did better with physical therapy and now recommending home health.  Still with a lot of cough. 1/20.  Patient feeling better with regards to his cough and his strength. 1/21.  Patient feeling better and strength has improved.  Will go home with home health.

## 2022-12-21 NOTE — Evaluation (Signed)
Occupational Therapy Evaluation Patient Details Name: Alejandro Wall MRN: [id number] DOB: 16-Oct-1956 Today's Date: 12/21/2022   History of Present Illness Alejandro Wall is a 81yoM who [facility] on 1/[facility] via EMS c 24 hours weakness, fell when he went to get the mail, found by a passerby. Pt apparently fell out of bed to floor in ED as well. Pt presenting with tachycardia, (+) COVID test. Head imaging done given multiple falls with head impact, no remarkable findings. PMH CVA c Rt hemiplegia pt reports fully resolved. At baseline Pt lives alone, AMB without device, is retired.   Clinical Impression   Patient presenting with decreased Ind in self care,balance, functional mobility/transfers, endurance, and safety awareness. Patient reports living at home alone independently without any use of AD. He has had several falls in the last 6 months. Patient needing min A for bed mobility with posterior bias in sitting. Pt donning L sock with assistance for posterior bias and needing to attempt circle sitting to don on R foot. Pt then attempts to lay down several times endorsing increased fatigue. He declined standing or functional transfers secondary to fatigue.  Patient will benefit from acute OT to increase overall independence in the areas of ADLs, functional mobility, and safety awareness in order to safely discharge to next venue of care.     Recommendations for follow up therapy are one component of a multi-disciplinary discharge planning process, led by the attending physician.  Recommendations may be updated based on patient status, additional functional criteria and insurance authorization.   Follow Up Recommendations  Skilled nursing-short term rehab (<3 hours/day)     Assistance Recommended at Discharge Intermittent Supervision/Assistance  Patient can return home with the following A little help with walking and/or transfers;A lot of help with bathing/dressing/bathroom;Assistance with  cooking/housework;Help with stairs or ramp for entrance;Assist for transportation    Functional Status Assessment  Patient has had a recent decline in their functional status and demonstrates the ability to make significant improvements in function in a reasonable and predictable amount of time.  Equipment Recommendations  Other (comment) (defer to next venue of care)       Precautions / Restrictions Precautions Precautions: Fall Restrictions Weight Bearing Restrictions: No      Mobility Bed Mobility Overal bed mobility: Needs Assistance Bed Mobility: Supine to Sit, Sit to Supine     Supine to sit: Min assist Sit to supine: Min assist   General bed mobility comments: posterior bias    Transfers     Transfers: Sit to/from Stand             General transfer comment: Pt refusal to attempt standing secondary to fatigue      Balance Overall balance assessment: History of Falls, Mild deficits observed, not formally tested, Needs assistance Sitting-balance support: Single extremity supported Sitting balance-Leahy Scale: Fair   Postural control: Posterior lean                                 ADL either performed or assessed with clinical judgement   ADL Overall ADL's : Needs assistance/impaired                     Lower Body Dressing: Sit to/from stand;Moderate assistance Lower Body Dressing Details (indicate cue type and reason): assistance to don B socks  Vision Patient Visual Report: No change from baseline              Pertinent Vitals/Pain Pain Assessment Pain Assessment: No/denies pain     Hand Dominance Right   Extremity/Trunk Assessment Upper Extremity Assessment Upper Extremity Assessment: Generalized weakness   Lower Extremity Assessment Lower Extremity Assessment: Generalized weakness       Communication Communication Communication: No difficulties   Cognition Arousal/Alertness:  Awake/alert Behavior During Therapy: WFL for tasks assessed/performed Overall Cognitive Status: Within Functional Limits for tasks assessed                                                  Home Living Family/patient expects to be discharged to:: Private residence Living Arrangements: Alone Available Help at Discharge: Family Type of Home: House Home Access: Ramped entrance     Home Layout: One level     Bathroom Shower/Tub: Tub/shower unit         Home Equipment: Conservation officer, nature (2 wheels);Cane - single point   Additional Comments: DME belonged to his mother      Prior Functioning/Environment Prior Level of Function : Independent/Modified Independent;Driving;History of Falls (last six months)             Mobility Comments: 2 falls this admission, 1 previous fall in past 6 months, unclear etiology; no device or imbalance at baseline; modI AMB ADLs Comments: independent        OT Problem List: Decreased strength;Decreased range of motion;Decreased activity tolerance;Decreased safety awareness;Impaired balance (sitting and/or standing);Decreased knowledge of use of DME or AE      OT Treatment/Interventions: Self-care/ADL training;Therapeutic exercise;Therapeutic activities;Energy conservation;Visual/perceptual remediation/compensation;DME and/or AE instruction;Patient/family education;Balance training    OT Goals(Current goals can be found in the care plan section) Acute Rehab OT Goals Patient Stated Goal: to get stronger OT Goal Formulation: With patient Time For Goal Achievement: 01/04/23 Potential to Achieve Goals: Fair ADL Goals Pt Will Perform Grooming: with supervision;standing Pt Will Perform Lower Body Dressing: with supervision;sit to/from stand Pt Will Transfer to Toilet: with supervision;ambulating Pt Will Perform Toileting - Clothing Manipulation and hygiene: with supervision;sit to/from stand  OT Frequency: Min 2X/week        AM-PAC OT "6 Clicks" Daily Activity     Outcome Measure Help from another person eating meals?: None Help from another person taking care of personal grooming?: A Little Help from another person toileting, which includes using toliet, bedpan, or urinal?: A Lot Help from another person bathing (including washing, rinsing, drying)?: A Lot Help from another person to put on and taking off regular upper body clothing?: A Little Help from another person to put on and taking off regular lower body clothing?: A Lot 6 Click Score: 16   End of Session    Activity Tolerance: Patient limited by fatigue Patient left: in bed;with bed alarm set;with call bell/phone within reach  OT Visit Diagnosis: Unsteadiness on feet (R26.81);Repeated falls (R29.6);Muscle weakness (generalized) (M62.81)                Time: 7096-2836 OT Time Calculation (min): 14 min Charges:  OT General Charges $OT Visit: 1 Visit OT Evaluation $OT Eval Moderate Complexity: 1 Mod OT Treatments $Self Care/Home Management : 8-22 mins  Darleen Crocker, MS, OTR/L , CBIS ascom [phone]  12/21/22, 12:04 PM

## 2022-12-21 NOTE — Evaluation (Signed)
Physical Therapy Evaluation Patient Details Name: Alejandro Wall MRN: [id number] DOB: 1956/09/25 Today's Date: 12/21/2022  History of Present Illness  Alejandro Wall is a 29yoM who comes to [facility] on 1/17 via EMS c 24 hours weakness, fell when he went to get the mail, found by a passerby. Pt apparently fell out of bed to floor in ED as well. Pt presenting with tachycardia, (+) COVID test. Head imaging done given multiple falls with head impact, no remarkable findings. PMH CVA c Rt hemiplegia pt reports fully resolved. At baseline Pt lives alone, AMB without device, is retired.  Clinical Impression  Pt awake in bed on entry agreeable to PT evaluation. Pt reports abrasion to bilat knees from falls, but no significant injury sustained, no pain to report today. He endorses subjective improvements in strength as he is able to move to EOB with success and less effort, same for rising to standing- unfortunately pt has LOB with each of these, neither of which pt is able to avoid all out fall without the aide of another human. Pt able to partake in short distance AMB in room, but is steady and slow. HR 116-120 while standing, no symptoms of ACS, SpO2 WNL. Pt lives alone and remains too weak and off balance to return to home safely at this time. He agrees that a short term rehab stay at SNF would allow for fastest return to baseline safety and independence in preparation for subsequent return to home.      Recommendations for follow up therapy are one component of a multi-disciplinary discharge planning process, led by the attending physician.  Recommendations may be updated based on patient status, additional functional criteria and insurance authorization.  Follow Up Recommendations Skilled nursing-short term rehab (<3 hours/day) Can patient physically be transported by private vehicle: Yes    Assistance Recommended at Discharge Intermittent Supervision/Assistance  Patient can return home with the following  A  little help with walking and/or transfers;A little help with bathing/dressing/bathroom;Assist for transportation;Direct supervision/assist for medications management;Assistance with cooking/housework;Help with stairs or ramp for entrance    Equipment Recommendations None recommended by PT  Recommendations for Other Services       Functional Status Assessment Patient has had a recent decline in their functional status and demonstrates the ability to make significant improvements in function in a reasonable and predictable amount of time.     Precautions / Restrictions Precautions Precautions: Fall Restrictions Weight Bearing Restrictions: No      Mobility  Bed Mobility Overal bed mobility: Modified Independent Bed Mobility: Supine to Sit     Supine to sit: Min guard, Min assist     General bed mobility comments: a few instances fof posterior lean exceeding threshold of control, author assists with anterior righting of trunk, happens again with donning of 2nd shoe    Transfers Overall transfer level: Needs assistance Equipment used: None Transfers: Sit to/from Stand Sit to Stand: Supervision, Min guard           General transfer comment: retropulsion beyond righting capacity, falls into sitting EOB while author is assessing oximetry, no harm sustained    Ambulation/Gait Ambulation/Gait assistance: Min guard Gait Distance (Feet): 48 Feet Assistive device: None Gait Pattern/deviations: Step-to pattern       General Gait Details: AMB from bed side, around to other side and back, does this twice, includes turning, lateral stepping, tight spaces, intermittent use of hand on foot board, no gross LOB.  Stairs  Wheelchair Mobility    Modified Rankin (Stroke Patients Only)       Balance Overall balance assessment: History of Falls, Mild deficits observed, not formally tested, Needs assistance Sitting-balance support: Single extremity  supported Sitting balance-Leahy Scale: Fair     Standing balance support: Single extremity supported, During functional activity Standing balance-Leahy Scale: Fair                               Pertinent Vitals/Pain Pain Assessment Pain Assessment: No/denies pain    Home Living Family/patient expects to be discharged to:: Private residence Living Arrangements: Alone Available Help at Discharge: Family (Sister lives in [place]) Type of Home: House Home Access: Ramped entrance       Home Layout: One level Home Equipment: Conservation officer, nature (2 wheels);Cane - single point Additional Comments: DME belonged to his mother    Prior Function Prior Level of Function : Independent/Modified Independent;Driving;History of Falls (last six months)             Mobility Comments: 2 falls this admission, 1 previous fall in past 6 months, unclear etiology; no device or imbalance at baseline; modI AMB ADLs Comments: independent     Hand Dominance        Extremity/Trunk Assessment                Communication      Cognition Arousal/Alertness: Awake/alert Behavior During Therapy: WFL for tasks assessed/performed Overall Cognitive Status: Within Functional Limits for tasks assessed                                          General Comments      Exercises     Assessment/Plan    PT Assessment Patient needs continued PT services  PT Problem List Decreased strength;Decreased range of motion;Decreased activity tolerance;Decreased balance;Decreased mobility;Decreased coordination;Decreased cognition;Decreased knowledge of use of DME;Decreased safety awareness;Decreased knowledge of precautions;Cardiopulmonary status limiting activity       PT Treatment Interventions DME instruction;Neuromuscular re-education;Gait training;Stair training;Functional mobility training;Therapeutic activities;Therapeutic exercise;Balance training;Patient/family  education    PT Goals (Current goals can be found in the Care Plan section)  Acute Rehab PT Goals Patient Stated Goal: regain baseline independence and safety PT Goal Formulation: With patient Time For Goal Achievement: 01/04/23 Potential to Achieve Goals: Good    Frequency Min 2X/week     Co-evaluation               AM-PAC PT "6 Clicks" Mobility  Outcome Measure Help needed turning from your back to your side while in a flat bed without using bedrails?: A Little Help needed moving from lying on your back to sitting on the side of a flat bed without using bedrails?: A Little Help needed moving to and from a bed to a chair (including a wheelchair)?: A Little Help needed standing up from a chair using your arms (e.g., wheelchair or bedside chair)?: A Little Help needed to walk in hospital room?: A Little Help needed climbing 3-5 steps with a railing? : A Little 6 Click Score: 18    End of Session   Activity Tolerance: Patient tolerated treatment well;No increased pain;Patient limited by fatigue Patient left: in chair;with chair alarm set;with call bell/phone within reach Nurse Communication: Mobility status PT Visit Diagnosis: Difficulty in walking, not elsewhere classified (R26.2);Other abnormalities of gait and  mobility (R26.89);Unsteadiness on feet (R26.81)    Time: 7588-3254 PT Time Calculation (min) (ACUTE ONLY): 22 min   Charges:   PT Evaluation $PT Eval Moderate Complexity: 1 Mod     9:05 AM, 12/21/22 Etta Grandchild, PT, DPT Physical Therapist - [facility]  [phone] ([place])    Daelynn Blower C 12/21/2022, 9:00 AM

## 2022-12-22 DIAGNOSIS — M4802 Spinal stenosis, cervical region: Secondary | ICD-10-CM

## 2022-12-22 DIAGNOSIS — R911 Solitary pulmonary nodule: Secondary | ICD-10-CM | POA: Diagnosis not present

## 2022-12-22 DIAGNOSIS — E871 Hypo-osmolality and hyponatremia: Secondary | ICD-10-CM | POA: Diagnosis not present

## 2022-12-22 DIAGNOSIS — R29898 Other symptoms and signs involving the musculoskeletal system: Secondary | ICD-10-CM | POA: Diagnosis not present

## 2022-12-22 DIAGNOSIS — K219 Gastro-esophageal reflux disease without esophagitis: Secondary | ICD-10-CM

## 2022-12-22 DIAGNOSIS — U071 COVID-19: Secondary | ICD-10-CM | POA: Diagnosis not present

## 2022-12-22 LAB — PROCALCITONIN: Procalcitonin: <0.1 ng/mL

## 2022-12-22 LAB — VITAMIN B12: Vitamin B-12: 847 pg/mL (ref 180–914)

## 2022-12-22 LAB — ACETYLCHOLINE RECEPTOR, BINDING: Acety choline binding ab: 0.04 nmol/L (ref 0.00–0.24)

## 2022-12-22 LAB — HEPATITIS C ANTIBODY: HCV Ab: NONREACTIVE

## 2022-12-22 NOTE — Progress Notes (Signed)
Physical Therapy Treatment Patient Details Name: Alejandro Wall MRN: [id number] DOB: [birth year] Today's Date: 12/22/2022   History of Present Illness Alejandro Wall is a 9yoM who comes to [facility] on 1/17 via EMS c 24 hours weakness, fell when he went to get the mail, found by a passerby. Pt apparently fell out of bed to floor in ED as well. Pt presenting with tachycardia, (+) COVID test. Head imaging done given multiple falls with head impact, no remarkable findings. PMH CVA c Rt hemiplegia pt reports fully resolved. At baseline Pt lives alone, AMB without device, is retired.    PT Comments    Pt reports dramatic improvement. He has been AMB ad lib in room most of day. He AMB 2 laps around unit with author, no device, 1 LOB which he rights without  assist. Will update DC recs to home c HHPT as per discussion with pt. He endorses confidence in safe DC.   Recommendations for follow up therapy are one component of a multi-disciplinary discharge planning process, led by the attending physician.  Recommendations may be updated based on patient status, additional functional criteria and insurance authorization.  Follow Up Recommendations  Home health PT Can patient physically be transported by private vehicle: Yes   Assistance Recommended at Discharge Intermittent Supervision/Assistance  Patient can return home with the following A little help with walking and/or transfers;A little help with bathing/dressing/bathroom;Assist for transportation;Assistance with cooking/housework   Equipment Recommendations  None recommended by PT    Recommendations for Other Services       Precautions / Restrictions Precautions Precautions: Fall Restrictions Weight Bearing Restrictions: No     Mobility  Bed Mobility Overal bed mobility: Independent                  Transfers Overall transfer level: Independent                      Ambulation/Gait Ambulation/Gait assistance: Min guard,  Supervision Gait Distance (Feet): 400 Feet Assistive device: None         General Gait Details: 1 slight LOB while turning and maintaining head rotation to talk to RN, self recovered.   Stairs             Wheelchair Mobility    Modified Rankin (Stroke Patients Only)       Balance Overall balance assessment: Modified Independent                                          Cognition Arousal/Alertness: Awake/alert Behavior During Therapy: WFL for tasks assessed/performed Overall Cognitive Status: Within Functional Limits for tasks assessed                                          Exercises      General Comments        Pertinent Vitals/Pain Pain Assessment Pain Assessment: No/denies pain    Home Living                          Prior Function            PT Goals (current goals can now be found in the care plan section) Acute Rehab PT Goals Patient Stated Goal: regain baseline independence  and safety PT Goal Formulation: With patient Time For Goal Achievement: 01/04/23 Potential to Achieve Goals: Good Progress towards PT goals: Progressing toward goals    Frequency    Min 2X/week      PT Plan Discharge plan needs to be updated    Co-evaluation              AM-PAC PT "6 Clicks" Mobility   Outcome Measure  Help needed turning from your back to your side while in a flat bed without using bedrails?: None Help needed moving from lying on your back to sitting on the side of a flat bed without using bedrails?: None Help needed moving to and from a bed to a chair (including a wheelchair)?: None Help needed standing up from a chair using your arms (e.g., wheelchair or bedside chair)?: None Help needed to walk in hospital room?: None Help needed climbing 3-5 steps with a railing? : A Little 6 Click Score: 23    End of Session Equipment Utilized During Treatment: Gait belt Activity Tolerance: Patient  tolerated treatment well;No increased pain Patient left: in chair;with chair alarm set;with call bell/phone within reach Nurse Communication: Mobility status PT Visit Diagnosis: Difficulty in walking, not elsewhere classified (R26.2);Other abnormalities of gait and mobility (R26.89);Unsteadiness on feet (R26.81)     Time: 4431-5400 PT Time Calculation (min) (ACUTE ONLY): 13 min  Charges:  $Therapeutic Activity: 8-22 mins                    3:56 PM, 12/22/22 Etta Grandchild, PT, DPT Physical Therapist - [facility]  [phone] ([place])    Domenique Southers C 12/22/2022, 3:55 PM

## 2022-12-22 NOTE — Care Management Important Message (Signed)
Important Message  Patient Details  Name: Alejandro Wall MRN: [id number] Date of Birth: Feb 20, 1956   Medicare Important Message Given:  N/A - LOS <3 / Initial given by admissions     [name] 12/22/2022, 7:41 AM

## 2022-12-22 NOTE — Consult Note (Signed)
Consult requested by:  Dr. Leslye Wall  Consult requested for:  Cervical stenosis  Primary Physician:  Alejandro Aus, MD  History of Present Illness: 12/22/2022 Alejandro Wall is here today with a chief complaint of weakness and falls.  He has been having falls for several months as well as episodic generalized weakness.  He does not have radicular pain in his upper extremities, but has had some LLE pain at times.    He tends to fall towards the right.  He felt very weak prior to presentation, and then was found to have COVID when he presented to [facility].  Imaging workup included C spine MRI as a possible cause of weakness and falls.  He does not endorse changes in dexterity or grip strength.  He denies numbness in his hands.  I have utilized the care everywhere function in epic to review the outside records available from external health systems.  Review of Systems:  A 10 point review of systems is negative, except for the pertinent positives and negatives detailed in the HPI.  Past Medical History: Past Medical History:  Diagnosis Date   Bradycardia    Cancer ([place])    Hyperlipidemia    Hypertension    Stroke ([place])    TIA (transient ischemic attack)    Tobacco abuse     Past Surgical History: Past Surgical History:  Procedure Laterality Date   FINGER SURGERY     HERNIA REPAIR     x 2   VARICOSE VEIN SURGERY      Allergies: Allergies as of 12/20/2022 - Review Complete 12/20/2022  Allergen Reaction Noted   Venlafaxine Other (See Comments) 01/04/2018   Aripiprazole Other (See Comments) 06/14/2021    Medications: Current Meds  Medication Sig   aspirin 81 MG tablet Take 81 mg by mouth daily.   cetirizine (ZYRTEC) 10 MG tablet Take 10 mg by mouth daily.   clonazePAM (KLONOPIN) 0.5 MG tablet Take 1 mg by mouth 2 (two) times daily.   cyanocobalamin (VITAMIN B12) 1000 MCG tablet Take 1,000 mcg by mouth daily.   lisinopril (ZESTRIL) 10 MG tablet Take 10 mg by mouth  daily.   OLANZapine (ZYPREXA) 5 MG tablet Take 5 mg by mouth at bedtime.   omeprazole (PRILOSEC) 40 MG capsule Take 40 mg by mouth daily.   rosuvastatin (CRESTOR) 5 MG tablet Take 5 mg by mouth daily.   traZODone (DESYREL) 100 MG tablet Take 200 mg by mouth at bedtime.    Social History: Social History   Tobacco Use   Smoking status: Former    Packs/day: 1.00    Years: 20.00    Total pack years: 20.00    Types: Cigarettes  Substance Use Topics   Alcohol use: No   Drug use: No    Family Medical History: Family History  Problem Relation Age of Onset   Heart failure Mother    Diabetes Mother    Dementia Mother    Cirrhosis Mother    Atrial fibrillation Mother    COPD Father     Physical Examination: Vitals:   12/22/22 0008 12/22/22 0830  BP: 122/88 113/79  Pulse: 62 68  Resp: 18 19  Temp: (!) 97.4 F (36.3 C) 97.9 F (36.6 C)  SpO2: 94% 95%    General: Patient is well developed, well nourished, calm, collected, and in no apparent distress. Attention to examination is appropriate.  Neck:   Supple.  Full range of motion.  Respiratory: Patient is breathing  without any difficulty.   NEUROLOGICAL:     Awake, alert, oriented to person, place, and time.  Speech is clear and fluent.  Cranial Nerves: Pupils equal round and reactive to light.  Facial tone is symmetric.  Facial sensation is symmetric. Shoulder shrug is symmetric. Tongue protrusion is midline.    Strength: Side Biceps Triceps Deltoid Interossei Grip Wrist Ext. Wrist Flex.  R '5 5 5 5 5 5 5  '$ L '5 5 5 5 5 5 5   '$ Side Iliopsoas Quads Hamstring PF DF EHL  R '5 5 5 5 5 5  '$ L '5 5 5 5 5 5   '$ Reflexes are 1+ and symmetric at the biceps, triceps, brachioradialis, patella and achilles.   Hoffman's is absent.   Bilateral upper and lower extremity sensation is intact to light touch.    No evidence of dysmetria noted.  Gait is untested.     Medical Decision Making  Imaging: C spine MRI  12/20/22 IMPRESSION: Multilevel degenerative changes resulting in mild spinal canal stenosis C4-C5 through C6-C7 and multilevel moderate and severe neural foraminal stenosis as detailed above.     Electronically Signed   By: Alejandro Wall M.D.   On: 12/20/2022 20:00  L spine MRI 12/21/22 IMPRESSION: 1. Lumbar spondylosis and degenerative disc disease, causing mild impingement at L4-5.     Electronically Signed   By: Alejandro Wall M.D.   On: 12/21/2022 20:47  I have personally reviewed the images and agree with the above interpretation.  Assessment and Plan: Alejandro Wall is a pleasant 67 y.o. male with intermittent weakness and falls.  His C and L spine imaging does not explain his current symptoms.  He may have radicular symptoms from his C spine in the future, but that is not a prominent issue currently.  - Defer to neurology on further workup - No surgical intervention recommended - If he develops radicular pain in his arms in the future, I would be happy to see him as an outpatient  I have communicated my recommendations to the requesting physician and coordinated care to facilitate these recommendations.     Alejandro Yagi K. Izora Ribas MD, [facility] Neurosurgery

## 2022-12-22 NOTE — Plan of Care (Signed)
MRI L-spine personally reviewed, agree with radiology:   1. Lumbar spondylosis and degenerative disc disease, causing mild impingement at L4-5.  -If patient prefers outpatient follow-up with [facility] clinic neurology, should be given number to call for appointment [phone]; neurology is happy to assist with referral to [facility] or [facility] neurology if patient prefers to follow-up within the [facility] health system  On review of chart, per primary team patient's bilateral leg weakness has been improving with holding statin and they are suspecting a statin related side effect  Neurology will be available as needed

## 2022-12-22 NOTE — Plan of Care (Signed)

## 2022-12-22 NOTE — Progress Notes (Signed)
Progress Note   Patient: Alejandro Wall:[id number] DOB: June 05, 1956 DOA: 12/20/2022     2 DOS: the patient was seen and examined on 12/22/2022   Brief hospital course: 67 y.o. male with medical history significant of cardiovascular disease, hypertension, hyperlipidemia, anxiety depression and insomnia.  He states for the past month he has been very weak.  He states that his legs and arms have been giving out on him.  It has been intermittent.  Had a couple falls at home.  1 here in the emergency room.  Had a runny nose and sore throat the other day.  Diagnosed with COVID infection here in the hospital.  He states he has been having some fever and chills.  Complains of generalized weakness especially in his arms.  He has been having some joint and muscle pains.  Hospital services were contacted for further admission with COVID infection and weaknes   MRI cervical spine showed multilevel degenerative changes with resulting mild spinal canal stenosis C4-C5 through C6-C7 and multilevel moderate and severe neuroforaminal foraminal stenosis.  Neurosurgery does not believe that this is causing his bilateral arm and leg weakness and recommended neurology consultation.  1/19.  Patient feeling stronger and his appetite coming back.  Did better with physical therapy and now recommending home health.  Still with a lot of cough.   Assessment and Plan: * COVID-19 virus infection Supportive care.  PT and OT evaluations.  With elevated CRP Decadron started.  Patient also having low-grade temperature.  Bilateral arm weakness MRI cervical spine does not explain his symptoms.  Slowly improving with physical therapy and Occupational Therapy.  Acetylcholinesterase antibody negative.  Wondering if this was due to Crestor.  Bilateral leg weakness MRI of the lumbar spine does not explain his symptoms.  Strength slowly improving.  Wondering if this was due to Crestor.  Pulmonary nodule Will need repeat CT scan in 6  to 12 months.  Could be related to COVID infection.  Hyponatremia Sodium only 1 point less than the normal range on presentation now in the normal range.  Thrombocytopenia ([place]) Looks chronic looking back at old labs.  Hepatitis C antibody negative.  Hyperlipidemia, unspecified Hold Crestor and do a statin holiday.  Will continue to hold at this point since the patient has improved.  Essential hypertension Continue lisinopril  GERD (gastroesophageal reflux disease) Continue PPI  Anxiety and depression Continue clonazepam and trazodone  Insomnia Patient on Zyprexa and trazodone at night        Subjective: Patient states he is starting to feel his strength come back.  His appetite has improved.  Feeling a little bit better.  Still with a lot of cough.  Physical Exam: Vitals:   12/21/22 1830 12/22/22 0008 12/22/22 0830 12/22/22 1602  BP: 118/80 122/88 113/79 107/74  Pulse: 80 62 68 65  Resp: '18 18 19 16  '$ Temp: 98.9 F (37.2 C) (!) 97.4 F (36.3 C) 97.9 F (36.6 C) 97.9 F (36.6 C)  TempSrc: Oral  Oral   SpO2: 94% 94% 95% 95%  Weight:      Height:       Physical Exam HENT:     Head: Normocephalic.     Mouth/Throat:     Pharynx: No oropharyngeal exudate.  Eyes:     General: Lids are normal.     Conjunctiva/sclera: Conjunctivae normal.  Cardiovascular:     Rate and Rhythm: Normal rate and regular rhythm.     Heart sounds: Normal heart sounds, S1 normal  and S2 normal.  Pulmonary:     Breath sounds: Normal breath sounds. No decreased breath sounds, wheezing, rhonchi or rales.  Abdominal:     Palpations: Abdomen is soft.     Tenderness: There is no abdominal tenderness.  Musculoskeletal:     Right lower leg: No swelling.     Left lower leg: No swelling.  Skin:    General: Skin is warm.     Findings: No rash.  Neurological:     Mental Status: He is alert and oriented to person, place, and time.     Comments: Power 4+ out of 5 bilateral upper and lower  extremities.     Data Reviewed: Procalcitonin negative, vitamin B12 847, hepatitis C nonreactive  Family Communication: Updated patient's sister yesterday.  Disposition: Status is: Inpatient Remains inpatient appropriate because: Continue IV steroids with elevated CRP  Planned Discharge Destination: Home with Home Health    Time spent: 27 minutes  Author: Loletha Grayer, MD 12/22/2022 5:45 PM  For on call review [email or web address].

## 2022-12-23 DIAGNOSIS — R911 Solitary pulmonary nodule: Secondary | ICD-10-CM | POA: Diagnosis not present

## 2022-12-23 DIAGNOSIS — E871 Hypo-osmolality and hyponatremia: Secondary | ICD-10-CM | POA: Diagnosis not present

## 2022-12-23 DIAGNOSIS — U071 COVID-19: Secondary | ICD-10-CM | POA: Diagnosis not present

## 2022-12-23 DIAGNOSIS — E785 Hyperlipidemia, unspecified: Secondary | ICD-10-CM

## 2022-12-23 DIAGNOSIS — R29898 Other symptoms and signs involving the musculoskeletal system: Secondary | ICD-10-CM | POA: Diagnosis not present

## 2022-12-23 LAB — PROCALCITONIN: Procalcitonin: <0.1 ng/mL

## 2022-12-23 MED ORDER — DEXAMETHASONE 6 MG PO TABS
6.0000 mg | ORAL_TABLET | Freq: Every day | ORAL | Status: DC
  Administered 2022-12-24: 6 mg via ORAL
  Filled 2022-12-23: qty 1

## 2022-12-23 NOTE — Plan of Care (Signed)
  Problem: Nutrition: Goal: Adequate nutrition will be maintained Outcome: Progressing   Problem: Activity: Goal: Risk for activity intolerance will decrease Outcome: Progressing   Problem: Elimination: Goal: Will not experience complications related to bowel motility Outcome: Progressing Goal: Will not experience complications related to urinary retention Outcome: Progressing   Problem: Pain Managment: Goal: General experience of comfort will improve Outcome: Progressing   Problem: Coping: Goal: Level of anxiety will decrease Outcome: Progressing

## 2022-12-23 NOTE — Progress Notes (Signed)
Progress Note   Patient: Alejandro Wall UEK:[id number] DOB: [birth year] DOA: 12/20/2022     3 DOS: the patient was seen and examined on 12/23/2022   Brief hospital course: 67 y.o. male with medical history significant of cardiovascular disease, hypertension, hyperlipidemia, anxiety depression and insomnia.  He states for the past month he has been very weak.  He states that his legs and arms have been giving out on him.  It has been intermittent.  Had a couple falls at home.  1 here in the emergency room.  Had a runny nose and sore throat the other day.  Diagnosed with COVID infection here in the hospital.  He states he has been having some fever and chills.  Complains of generalized weakness especially in his arms.  He has been having some joint and muscle pains.  Hospital services were contacted for further admission with COVID infection and weaknes   MRI cervical spine showed multilevel degenerative changes with resulting mild spinal canal stenosis C4-C5 through C6-C7 and multilevel moderate and severe neuroforaminal foraminal stenosis.  Neurosurgery does not believe that this is causing his bilateral arm and leg weakness and recommended neurology consultation.  1/19.  Patient feeling stronger and his appetite coming back.  Did better with physical therapy and now recommending home health.  Still with a lot of cough. 1/20.  Patient feeling better with regards to his cough and his strength.  Assessment and Plan: * COVID-19 virus infection Supportive care.  Switch Decadron to oral for tomorrow morning.  Appreciate PT and OT consultations.  Likely disposition home tomorrow.  Bilateral arm weakness MRI cervical spine does not explain his symptoms.  Slowly improving with physical therapy and Occupational Therapy.  Acetylcholinesterase antibody negative.  I suspect this is secondary to the Crestor.  Continue statin holiday.  Bilateral leg weakness MRI of the lumbar spine does not explain his symptoms.   Strength slowly improving.  I suspect secondary to Crestor.  Continue statin holiday.  Pulmonary nodule Will need repeat CT scan in 6 to 12 months.  Could be related to COVID infection.  Hyponatremia Sodium only 1 point less than the normal range on presentation now in the normal range.  Thrombocytopenia ([place]) Looks chronic looking back at old labs.  Hepatitis C antibody negative.  Hyperlipidemia, unspecified Hold Crestor and do a statin holiday.  Will continue to hold at this point since the patient has improved.  Essential hypertension Continue lisinopril  GERD (gastroesophageal reflux disease) Continue PPI  Anxiety and depression Continue clonazepam and trazodone  Insomnia Patient on Zyprexa and trazodone at night        Subjective: Patient feeling better every day.  Still has a little cough.  Strength is coming back.  Appetite improved.  Admitted with COVID infection and bilateral arm and leg weakness.  Physical Exam: Vitals:   12/22/22 0830 12/22/22 1602 12/23/22 0036 12/23/22 0727  BP: 113/79 107/74 109/73 106/70  Pulse: 68 65 (!) 56 (!) 59  Resp: '19 16 18 17  '$ Temp: 97.9 F (36.6 C) 97.9 F (36.6 C) 98 F (36.7 C) 98 F (36.7 C)  TempSrc: Oral Oral    SpO2: 95% 95% 96% 93%  Weight:      Height:       Physical Exam HENT:     Head: Normocephalic.     Mouth/Throat:     Pharynx: No oropharyngeal exudate.  Eyes:     General: Lids are normal.     Conjunctiva/sclera: Conjunctivae normal.  Cardiovascular:  Rate and Rhythm: Normal rate and regular rhythm.     Heart sounds: Normal heart sounds, S1 normal and S2 normal.  Pulmonary:     Breath sounds: Normal breath sounds. No decreased breath sounds, wheezing, rhonchi or rales.  Abdominal:     Palpations: Abdomen is soft.     Tenderness: There is no abdominal tenderness.  Musculoskeletal:     Right lower leg: No swelling.     Left lower leg: No swelling.  Skin:    General: Skin is warm.     Findings:  No rash.  Neurological:     Mental Status: He is alert and oriented to person, place, and time.     Comments: Patient seen ambulating back from bathroom.     Data Reviewed: Procalcitonin negative  Family Communication: Patient spoke with his sister on the phone while is in the room and likely will be able to go home tomorrow  Disposition: Status is: Inpatient Remains inpatient appropriate because: IV steroid today and hopefully be able to discharge home tomorrow.  Planned Discharge Destination: Home with Home Health    Time spent: 28 minutes Case discussed with nursing staff.  Author: Loletha Grayer, MD 12/23/2022 1:20 PM  For on call review [email or web address].

## 2022-12-23 NOTE — Progress Notes (Signed)
Occupational Therapy Treatment Patient Details Name: Alejandro Wall MRN: [id number] DOB: [birth year] Today's Date: 12/23/2022   History of present illness Alejandro Wall is a 44yoM who comes [facility] on 1/17 via EMS c 24 hours weakness, fell when he went to get the mail, found by a passerby. Pt apparently fell out of bed to floor in ED as well. Pt presenting with tachycardia, (+) COVID test. Head imaging done given multiple falls with head impact, no remarkable findings. PMH CVA c Rt hemiplegia pt reports fully resolved. At baseline Pt lives alone, AMB without device, is retired.   OT comments  Upon entering session, pt supine in bed and agreeable to OT. Tx session targeted increasing activity tolerance for improved ADL completion. Pt deferred grooming and toileting tasks this date. Tx session then focused on discussion of energy conservation and strategies for safe transition home (fall prevention, DME for bathing, activity pacing, home/routines modification). Pt verbalized understanding. Pt is making progress toward goal completion. Updated D/C recs to [facility] due to progress and per pt request. OT will continue to follow acutely.     Recommendations for follow up therapy are one component of a multi-disciplinary discharge planning process, led by the attending physician.  Recommendations may be updated based on patient status, additional functional criteria and insurance authorization.    Follow Up Recommendations  Home health OT     Assistance Recommended at Discharge Intermittent Supervision/Assistance  Patient can return home with the following  A little help with walking and/or transfers;Assistance with cooking/housework;Help with stairs or ramp for entrance;Assist for transportation;A little help with bathing/dressing/bathroom   Equipment Recommendations  None recommended by OT    Recommendations for Other Services      Precautions / Restrictions Precautions Precautions:  Fall Restrictions Weight Bearing Restrictions: No       Mobility Bed Mobility Overal bed mobility: Independent                  Transfers Overall transfer level: Modified independent Equipment used: None Transfers: Sit to/from Stand             General transfer comment: STS from EOB     Balance Overall balance assessment: Modified Independent                                         ADL either performed or assessed with clinical judgement   ADL Overall ADL's : Needs assistance/impaired                                     Functional mobility during ADLs: Supervision/safety (2 laps around nurse's station, ~300 ft, no AD) General ADL Comments: Pt deferred grooming and toileting tasks this date    Extremity/Trunk Assessment Upper Extremity Assessment Upper Extremity Assessment: Generalized weakness   Lower Extremity Assessment Lower Extremity Assessment: Generalized weakness        Vision Patient Visual Report: No change from baseline     Perception     Praxis      Cognition Arousal/Alertness: Awake/alert Behavior During Therapy: WFL for tasks assessed/performed Overall Cognitive Status: Within Functional Limits for tasks assessed  Exercises      Shoulder Instructions       General Comments      Pertinent Vitals/ Pain       Pain Assessment Pain Assessment: No/denies pain  Home Living                                          Prior Functioning/Environment              Frequency  Min 2X/week        Progress Toward Goals  OT Goals(current goals can now be found in the care plan section)  Progress towards OT goals: Progressing toward goals  Acute Rehab OT Goals Patient Stated Goal: to get stronger OT Goal Formulation: With patient Time For Goal Achievement: 01/04/23 Potential to Achieve Goals: Good  Plan Discharge  plan needs to be updated;Frequency remains appropriate    Co-evaluation                 AM-PAC OT "6 Clicks" Daily Activity     Outcome Measure   Help from another person eating meals?: None Help from another person taking care of personal grooming?: A Little Help from another person toileting, which includes using toliet, bedpan, or urinal?: A Little Help from another person bathing (including washing, rinsing, drying)?: A Little Help from another person to put on and taking off regular upper body clothing?: None Help from another person to put on and taking off regular lower body clothing?: A Little 6 Click Score: 20    End of Session Equipment Utilized During Treatment: Gait belt  OT Visit Diagnosis: Unsteadiness on feet (R26.81);Repeated falls (R29.6);Muscle weakness (generalized) (M62.81)   Activity Tolerance Patient tolerated treatment well   Patient Left in bed;with bed alarm set;with call bell/phone within reach   Nurse Communication Mobility status        Time: 8978-4784 OT Time Calculation (min): 16 min  Charges: OT General Charges $OT Visit: 1 Visit OT Treatments $Therapeutic Activity: 8-22 mins  [facility] MS, OTR/L ascom [phone]  12/23/22, 4:14 PM

## 2022-12-23 NOTE — Plan of Care (Signed)

## 2022-12-24 DIAGNOSIS — U071 COVID-19: Secondary | ICD-10-CM | POA: Diagnosis not present

## 2022-12-24 DIAGNOSIS — E871 Hypo-osmolality and hyponatremia: Secondary | ICD-10-CM | POA: Diagnosis not present

## 2022-12-24 DIAGNOSIS — R29898 Other symptoms and signs involving the musculoskeletal system: Secondary | ICD-10-CM | POA: Diagnosis not present

## 2022-12-24 DIAGNOSIS — R911 Solitary pulmonary nodule: Secondary | ICD-10-CM | POA: Diagnosis not present

## 2022-12-24 MED ORDER — DEXAMETHASONE 2 MG PO TABS: ORAL_TABLET | ORAL | 0 refills | Status: DC

## 2022-12-24 NOTE — Progress Notes (Signed)
Pt was discharged today, pt was dressed in paper scrubs all belongings where packed up for him, Rx was sent to his pharmacy of choice, pt verbalized understanding of teaching , IV site removed site C/D/I. Pt discharged via w/c down to private car.

## 2022-12-24 NOTE — TOC Transition Note (Signed)
Transition of Care [facility]) - CM/SW Discharge Note   Patient Details  Name: Alejandro Wall MRN: [id number] Date of Birth: [birth year]  [facility] CM/SW Contact:  Izola Price, RN Phone Number: 12/24/2022, 10:33 AM   Clinical Narrative:  12/24/22: Initial and transition note.  Patient admitted 1/7 after a fall at home, found at [employer] by [employer]. In ED found to be Covid+. Also reportedly fell in ED out of the bed. Imaging was completed with no remarkable findings per notes. Htx of CVS with Right sides weakness, full resolved per patient. From home where he lives alone. He has a sister that lives about an hour away and will transport to home from discharge at [facility] today. RN CM asked if patient felt safe being home alone and he said yes, that he also had a neighbor across the street he could call that was closer in distance than his sister. He does have a PCP, he says he has all the needed equpment at home including walker, cane, Shower chair, BSC. [facility] has accepted for PT/OT [facility] services on discharge per [employer]. Patient will call Monday for follow up appointments listed in AVS. Patient indicates he has no difficulty obtaining or paying for medications and normally drives himself to any appointments, or can arrange with sister. Simmie Davies RN CM   PCP: Dr Emily Filbert [phone]   RX:  [place] [facility] [[id number]]  [id number] [place], [place] [[id number]]  Final next level of care: [place] Barriers to Discharge: Barriers Resolved   Patient Goals and CMS Choice      Discharge Placement                         Discharge Plan and Services Additional resources added to the After Visit Summary for                  DME Arranged: N/A DME Agency: NA       HH Arranged: PT, OT [place] Agency: [place] ([place]) Date [place]: 12/24/22 Time [place]: 1033 Representative spoke with at [place]: Lavonia Drafts  Social Determinants of Health ([place]) Interventions SDOH Screenings   Food Insecurity: No Food Insecurity (12/21/2022)  Housing: Low Risk  (12/21/2022)  Transportation Needs: No Transportation Needs (12/21/2022)  Utilities: Not At Risk (12/21/2022)  Tobacco Use: Medium Risk (12/21/2022)     Readmission Risk Interventions     No data to display

## 2022-12-24 NOTE — Discharge Instructions (Signed)
You are on a statin holiday, we held crestor secondary to your muscle weakness.

## 2022-12-24 NOTE — Discharge Summary (Signed)
Physician Discharge Summary   Patient: Alejandro Wall MRN: [id number] DOB: 10/23/1956  Admit date:     12/20/2022  Discharge date: 12/24/22  Discharge Physician: Loletha Grayer   PCP: Rusty Aus, MD   Recommendations at discharge:   Follow-up PCP 5 days  Discharge Diagnoses: Principal Problem:   COVID-19 virus infection Active Problems:   Bilateral arm weakness   Bilateral leg weakness   Pulmonary nodule   Thrombocytopenia (HCC)   Hyponatremia   Hyperlipidemia, unspecified   Essential hypertension   GERD (gastroesophageal reflux disease)   Insomnia   Anxiety and depression   DNR (do not resuscitate)   Weakness   Spinal stenosis in cervical region    Hospital Course: 67 y.o. male with medical history significant of cardiovascular disease, hypertension, hyperlipidemia, anxiety depression and insomnia.  He states for the past month he has been very weak.  He states that his legs and arms have been giving out on him.  It has been intermittent.  Had a couple falls at home.  1 here in the emergency room.  Had a runny nose and sore throat the other day.  Diagnosed with COVID infection here in the hospital.  He states he has been having some fever and chills.  Complains of generalized weakness especially in his arms.  He has been having some joint and muscle pains.  Hospital services were contacted for further admission with COVID infection and weaknes   MRI cervical spine showed multilevel degenerative changes with resulting mild spinal canal stenosis C4-C5 through C6-C7 and multilevel moderate and severe neuroforaminal foraminal stenosis.  Neurosurgery does not believe that this is causing his bilateral arm and leg weakness and recommended neurology consultation.  Neurology saw and ordered an MRI of the lumbar spine which showed lumbar spondylosis and degenerative disc disease causing mild impingement at L4-L5 and again unlikely to cause the patient's symptoms.  1/19.  Patient  feeling stronger and his appetite coming back.  Did better with physical therapy and now recommending home health.  Still with a lot of cough. 1/20.  Patient feeling better with regards to his cough and his strength. 1/21.  Patient feeling better and strength has improved.  Will go home with home health.  Assessment and Plan: * COVID-19 virus infection Supportive care.  Switch Decadron to oral taper upon going home.  PT and OT recommending home health.  Bilateral arm weakness MRI cervical spine does not explain his symptoms.  Slowly improving with physical therapy and Occupational Therapy.  Acetylcholinesterase antibody negative.  I suspect this is secondary to the Crestor.  Continue statin holiday.  Bilateral leg weakness MRI of the lumbar spine does not explain his symptoms.  Strength slowly improving.  I suspect secondary to Crestor.  Continue statin holiday.  Pulmonary nodule Will need repeat CT scan in 6 to 12 months.  Could be related to COVID infection.  Hyponatremia Sodium only 1 point less than the normal range on presentation now in the normal range.  Thrombocytopenia ([place]) Looks chronic looking back at old labs.  Hepatitis C antibody negative.  Hyperlipidemia, unspecified Hold Crestor and do a statin holiday.  Will continue to hold at this point since the patient has improved.  Essential hypertension Continue lisinopril  GERD (gastroesophageal reflux disease) Continue PPI  Anxiety and depression Continue clonazepam and trazodone  Insomnia Patient on Zyprexa and trazodone at night   Recommend screening colonoscopy as outpatient if he has not had one.      Consultants: Neurosurgery  and neurology Procedures performed: None Disposition: Home health Diet recommendation:  Cardiac diet DISCHARGE MEDICATION: Allergies as of 12/24/2022       Reactions   Venlafaxine Other (See Comments)   Other Reaction(s): Unknown Anger  "made me angry" "made me angry"    Aripiprazole Other (See Comments)   Other Reaction(s): Other (See Comments) anger        Medication List     STOP taking these medications    rosuvastatin 5 MG tablet Commonly known as: CRESTOR       TAKE these medications    aspirin 81 MG tablet Take 81 mg by mouth daily.   cetirizine 10 MG tablet Commonly known as: ZYRTEC Take 10 mg by mouth daily.   clonazePAM 0.5 MG tablet Commonly known as: KLONOPIN Take 1 mg by mouth 2 (two) times daily.   cyanocobalamin 1000 MCG tablet Commonly known as: VITAMIN B12 Take 1,000 mcg by mouth daily.   dexamethasone 2 MG tablet Commonly known as: DECADRON 2 tabs po day1; 1 tab po day2,3; 1/2 tab po day4,5   lisinopril 10 MG tablet Commonly known as: ZESTRIL Take 10 mg by mouth daily.   OLANZapine 5 MG tablet Commonly known as: ZYPREXA Take 5 mg by mouth at bedtime.   omeprazole 40 MG capsule Commonly known as: PRILOSEC Take 40 mg by mouth daily.   traZODone 100 MG tablet Commonly known as: DESYREL Take 200 mg by mouth at bedtime.        Follow-up Information     Rusty Aus, MD Follow up in 5 day(s).   Specialty: Internal Medicine Contact information: [place] [address] [phone]         Meade Maw, MD Follow up.   Specialty: Neurosurgery Why: if worsening neck or low back pains Contact information: [place] [id number] [phone]                Discharge Exam: Danley Danker Weights   12/20/22 1609  Weight: 108.9 kg   Physical Exam HENT:     Head: Normocephalic.     Mouth/Throat:     Pharynx: No oropharyngeal exudate.  Eyes:     General: Lids are normal.     Conjunctiva/sclera: Conjunctivae normal.  Cardiovascular:     Rate and Rhythm: Normal rate and regular rhythm.     Heart sounds: Normal heart sounds, S1 normal and S2 normal.  Pulmonary:     Breath sounds: Normal breath sounds.  No decreased breath sounds, wheezing, rhonchi or rales.  Abdominal:     Palpations: Abdomen is soft.     Tenderness: There is no abdominal tenderness.  Musculoskeletal:     Right lower leg: No swelling.     Left lower leg: No swelling.  Skin:    General: Skin is warm.     Findings: No rash.  Neurological:     Mental Status: He is alert and oriented to person, place, and time.      Condition at discharge: stable  The results of significant diagnostics from this hospitalization (including imaging, microbiology, ancillary and laboratory) are listed below for reference.   Imaging Studies: MR LUMBAR SPINE WO CONTRAST  Result Date: 12/21/2022 CLINICAL DATA:  Lumbar radiculopathy, weakness. EXAM: MRI LUMBAR SPINE WITHOUT CONTRAST TECHNIQUE: Multiplanar, multisequence MR imaging of the lumbar spine was performed. No intravenous contrast was administered. COMPARISON:  CT abdomen 12/20/2022 FINDINGS: Segmentation: The lowest lumbar type non-rib-bearing vertebra  is labeled as L5. Alignment:  No vertebral subluxation is observed. Vertebrae: Mild disc desiccation at L1-2, L2-3, and L5-S1. There is some mild loss of intervertebral disc height at the L4-5 level. Minimal degenerative endplate findings noted. Conus medullaris and cauda equina: Conus extends to the T12-L1 level. Conus and cauda equina appear normal. Paraspinal and other soft tissues: Unremarkable Disc levels: L1-2: Unremarkable L2-3: No impingement. Left foraminal and lateral extraforaminal disc protrusion L3-4: No impingement. Disc bulge and mild intervertebral spurring along with mild degenerative facet arthropathy. L4-5: Mild left foraminal stenosis and mild left subarticular lateral recess stenosis due to degenerative facet arthropathy and disc osteophyte complex. L5-S1: No impingement.  Small central annular tear. There is a small right eccentric Tarlov cyst at the S2 level. IMPRESSION: 1. Lumbar spondylosis and degenerative disc disease,  causing mild impingement at L4-5. Electronically Signed   By: Van Clines M.D.   On: 12/21/2022 20:47   MR CERVICAL SPINE W WO CONTRAST  Result Date: 12/20/2022 CLINICAL DATA:  67 year old male with history of ITP and infarct presenting with weakness. EXAM: MRI CERVICAL SPINE WITHOUT AND WITH CONTRAST TECHNIQUE: Multiplanar and multiecho pulse sequences of the cervical spine, to include the craniocervical junction and cervicothoracic junction, were obtained without and with intravenous contrast. CONTRAST:  69m GADAVIST GADOBUTROL 1 MMOL/ML IV SOLN COMPARISON:  Same day cervical spine CT FINDINGS: Alignment: Normal. Vertebrae: Vertebral body heights are preserved. Background marrow signal is normal. There is no suspicious marrow signal abnormality or marrow edema. There is no abnormal marrow enhancement. There is degenerative endplate marrow signal abnormality at C6-C7. Cord: Normal in signal and morphology. There is no abnormal cord enhancement. Posterior Fossa, vertebral arteries, paraspinal tissues: The imaged posterior fossa is unremarkable. The vertebral artery flow voids are normal. The paraspinal soft tissues are unremarkable. Disc levels: There is disc desiccation and narrowing most advanced at C4-C5 through C6-C7. C2-C3: Mild right facet arthropathy with no significant spinal canal or neural foraminal stenosis C3-C4: There is bilateral uncovertebral ridging and mild facet arthropathy resulting severe bilateral neural foraminal stenosis without significant spinal canal stenosis C4-C5: There is a posterior disc osteophyte complex with prominent right worse than left uncovertebral ridging and mild facet arthropathy resulting in mild spinal canal stenosis with partial effacement of the ventral thecal sac and severe right worse than left neural foraminal stenosis C5-C6: There is a posterior disc osteophyte complex with bilateral uncovertebral ridging and severe right and moderate left neural foraminal  stenosis C6-C7: There is a posterior disc osteophyte complex with a likely disc bulge, and mild bilateral facet arthropathy resulting in mild spinal canal stenosis with partial effacement of the ventral thecal sac, and moderate left and mild right neural foraminal stenosis C7-T1: No significant spinal canal or neural foraminal stenosis. Facet arthropathy with ligamentum flavum thickening resulting in mild spinal canal stenosis with partial effacement of the dorsal thecal sac and IMPRESSION: Multilevel degenerative changes resulting in mild spinal canal stenosis C4-C5 through C6-C7 and multilevel moderate and severe neural foraminal stenosis as detailed above. Electronically Signed   By: PValetta MoleM.D.   On: 12/20/2022 20:00   CT ABDOMEN PELVIS W CONTRAST  Result Date: 12/20/2022 CLINICAL DATA:  Pulmonary embolism (PE) suspected, high prob; Abdominal pain, acute, nonlocalized EXAM: CT ANGIOGRAPHY CHEST CT ABDOMEN AND PELVIS WITH CONTRAST TECHNIQUE: Multidetector CT imaging of the chest was performed using the standard protocol during bolus administration of intravenous contrast. Multiplanar CT image reconstructions and MIPs were obtained to evaluate the vascular anatomy. Multidetector  CT imaging of the abdomen and pelvis was performed using the standard protocol during bolus administration of intravenous contrast. RADIATION DOSE REDUCTION: This exam was performed according to the departmental dose-optimization program which includes automated exposure control, adjustment of the mA and/or kV according to patient size and/or use of iterative reconstruction technique. CONTRAST:  151m OMNIPAQUE IOHEXOL 350 MG/ML SOLN COMPARISON:  None Available. FINDINGS: CTA CHEST FINDINGS Cardiovascular: Satisfactory opacification of the pulmonary arteries to the proximal segmental level. Respiratory motion artifact distorts the distal segmental and subsegmental branches. No evidence of pulmonary embolism to level of proximal  segmental level. The ascending aorta measures up to 4.2 cm (series 5, image 60).No pericardial disease. Mild atherosclerosis of the thoracic aorta. Mediastinum/Nodes: No lymphadenopathy. The thyroid is unremarkable. Small hiatal hernia. Lungs/Pleura: Respiratory motion artifact. There are ground-glass in the right and left apices and left basilar lung. Bibasilar atelectasis. Clustered nodularity in the lingula, largest measuring up to 6 mm (series 15 images 91 and 93). No pleural effusion or pneumothorax. Scattered lung scarring. No suspicious pulmonary nodules. Bibasilar hypoventilatory changes. Musculoskeletal: No acute osseous abnormality. No suspicious osseous lesion. Multilevel degenerative changes of the spine. Review of the MIP images confirms the above findings. CT ABDOMEN and PELVIS FINDINGS Hepatobiliary: No focal liver abnormality is seen. The gallbladder is unremarkable. Pancreas: Unremarkable. No pancreatic ductal dilatation or surrounding inflammatory changes. Spleen: Normal in size without focal abnormality. Adrenals/Urinary Tract: Adrenal glands are unremarkable. No hydronephrosis or nephrolithiasis. The bladder is mildly distended. Stomach/Bowel: Small hiatal hernia. Duodenal diverticulum along the second portion.No evidence of bowel obstruction. The appendix is normal. There are 2 areas of focal wall thickening which could be due to focally decompressed colon, 1 in the sigmoid colon (coronal image 46, and another in the rectum (coronal image 83). Vascular/Lymphatic: Aortoiliac atherosclerosis. No AAA. No lymphadenopathy. Reproductive: Unremarkable. Other: Small fat containing inguinal hernias, right greater than left. No bowel containing hernia. No ascites. No free air. Musculoskeletal: No acute osseous abnormality. No suspicious osseous lesion. Multilevel degenerative changes of the spine. Moderate bilateral hip osteoarthritis. Pseudoarticulation of the right L5 transverse process with the sacrum.  Review of the MIP images confirms the above findings. IMPRESSION: CTA CHEST: No evidence of pulmonary embolism to the level of the proximal segmental level. Respiratory motion artifact distorts the distal segmental and subsegmental vessels. Ground-glass in the right and left apices, likely infectious/inflammatory. Clustered nodularity in the lingula, largest measuring up to 6 mm, could also be infectious/inflammatory. Non-contrast chest CT at 6-12 months is recommended. If the nodule is stable at time of repeat CT, then future CT at 18-24 months (from today's scan) is considered optional for low-risk patients, but is recommended for high-risk patients. This recommendation follows the consensus statement: Guidelines for Management of Incidental Pulmonary Nodules Detected on CT Images: From the Fleischner Society 2017; Radiology 2017; 284:228-243. CT ABDOMEN AND PELVIS: No acute findings in the abdomen or pelvis. Two areas of focal wall thickening which could be due to focally decompressed colon, one in the sigmoid colon and another in the rectum. If not recently performed, recommend correlation with outpatient colonoscopy. Electronically Signed   By: JMaurine SimmeringM.D.   On: 12/20/2022 16:36   CT Angio Chest PE W and/or Wo Contrast  Result Date: 12/20/2022 CLINICAL DATA:  Pulmonary embolism (PE) suspected, high prob; Abdominal pain, acute, nonlocalized EXAM: CT ANGIOGRAPHY CHEST CT ABDOMEN AND PELVIS WITH CONTRAST TECHNIQUE: Multidetector CT imaging of the chest was performed using the standard protocol during bolus administration of  intravenous contrast. Multiplanar CT image reconstructions and MIPs were obtained to evaluate the vascular anatomy. Multidetector CT imaging of the abdomen and pelvis was performed using the standard protocol during bolus administration of intravenous contrast. RADIATION DOSE REDUCTION: This exam was performed according to the departmental dose-optimization program which includes  automated exposure control, adjustment of the mA and/or kV according to patient size and/or use of iterative reconstruction technique. CONTRAST:  163m OMNIPAQUE IOHEXOL 350 MG/ML SOLN COMPARISON:  None Available. FINDINGS: CTA CHEST FINDINGS Cardiovascular: Satisfactory opacification of the pulmonary arteries to the proximal segmental level. Respiratory motion artifact distorts the distal segmental and subsegmental branches. No evidence of pulmonary embolism to level of proximal segmental level. The ascending aorta measures up to 4.2 cm (series 5, image 60).No pericardial disease. Mild atherosclerosis of the thoracic aorta. Mediastinum/Nodes: No lymphadenopathy. The thyroid is unremarkable. Small hiatal hernia. Lungs/Pleura: Respiratory motion artifact. There are ground-glass in the right and left apices and left basilar lung. Bibasilar atelectasis. Clustered nodularity in the lingula, largest measuring up to 6 mm (series 15 images 91 and 93). No pleural effusion or pneumothorax. Scattered lung scarring. No suspicious pulmonary nodules. Bibasilar hypoventilatory changes. Musculoskeletal: No acute osseous abnormality. No suspicious osseous lesion. Multilevel degenerative changes of the spine. Review of the MIP images confirms the above findings. CT ABDOMEN and PELVIS FINDINGS Hepatobiliary: No focal liver abnormality is seen. The gallbladder is unremarkable. Pancreas: Unremarkable. No pancreatic ductal dilatation or surrounding inflammatory changes. Spleen: Normal in size without focal abnormality. Adrenals/Urinary Tract: Adrenal glands are unremarkable. No hydronephrosis or nephrolithiasis. The bladder is mildly distended. Stomach/Bowel: Small hiatal hernia. Duodenal diverticulum along the second portion.No evidence of bowel obstruction. The appendix is normal. There are 2 areas of focal wall thickening which could be due to focally decompressed colon, 1 in the sigmoid colon (coronal image 46, and another in the  rectum (coronal image 83). Vascular/Lymphatic: Aortoiliac atherosclerosis. No AAA. No lymphadenopathy. Reproductive: Unremarkable. Other: Small fat containing inguinal hernias, right greater than left. No bowel containing hernia. No ascites. No free air. Musculoskeletal: No acute osseous abnormality. No suspicious osseous lesion. Multilevel degenerative changes of the spine. Moderate bilateral hip osteoarthritis. Pseudoarticulation of the right L5 transverse process with the sacrum. Review of the MIP images confirms the above findings. IMPRESSION: CTA CHEST: No evidence of pulmonary embolism to the level of the proximal segmental level. Respiratory motion artifact distorts the distal segmental and subsegmental vessels. Ground-glass in the right and left apices, likely infectious/inflammatory. Clustered nodularity in the lingula, largest measuring up to 6 mm, could also be infectious/inflammatory. Non-contrast chest CT at 6-12 months is recommended. If the nodule is stable at time of repeat CT, then future CT at 18-24 months (from today's scan) is considered optional for low-risk patients, but is recommended for high-risk patients. This recommendation follows the consensus statement: Guidelines for Management of Incidental Pulmonary Nodules Detected on CT Images: From the Fleischner Society 2017; Radiology 2017; 284:228-243. CT ABDOMEN AND PELVIS: No acute findings in the abdomen or pelvis. Two areas of focal wall thickening which could be due to focally decompressed colon, one in the sigmoid colon and another in the rectum. If not recently performed, recommend correlation with outpatient colonoscopy. Electronically Signed   By: JMaurine SimmeringM.D.   On: 12/20/2022 16:36   CT HEAD WO CONTRAST (5MM)  Result Date: 12/20/2022 CLINICAL DATA:  Neck trauma (Age >= 65y); Head trauma, minor (Age >= 65y) EXAM: CT HEAD WITHOUT CONTRAST CT CERVICAL SPINE WITHOUT CONTRAST TECHNIQUE: Multidetector CT  imaging of the head and  cervical spine was performed following the standard protocol without intravenous contrast. Multiplanar CT image reconstructions of the cervical spine were also generated. RADIATION DOSE REDUCTION: This exam was performed according to the departmental dose-optimization program which includes automated exposure control, adjustment of the mA and/or kV according to patient size and/or use of iterative reconstruction technique. COMPARISON:  CT head 07/09/2020. FINDINGS: CT HEAD FINDINGS Brain: No evidence of acute infarction, hemorrhage, hydrocephalus, extra-axial collection or mass lesion/mass effect. Vascular: No hyperdense vessel. Skull: No acute fracture. Sinuses/Orbits: Clear sinuses.  No acute orbital findings. Other: No mastoid effusions. CT CERVICAL SPINE FINDINGS Alignment: No substantial sagittal subluxation. Skull base and vertebrae: Vertebral body heights are maintained. No evidence of acute fracture. Soft tissues and spinal canal: No prevertebral fluid or swelling. No visible canal hematoma. Disc levels: Moderate multilevel degenerative change, including degenerative disc disease, endplate spurring, facet and uncovertebral hypertrophy and varying degrees of neural foraminal stenosis. Probably severe right foraminal stenosis at C5-C6. Upper chest: Visualized lung apices are clear. IMPRESSION: 1. No evidence of acute intracranial abnormality. 2. No evidence of acute fracture or traumatic malalignment in the cervical spine. 3. Multilevel degenerative change, including probably severe right foraminal stenosis C5-C6. MRI of the cervical spine could further characterize if clinically warranted. Electronically Signed   By: Margaretha Sheffield M.D.   On: 12/20/2022 16:08   CT Cervical Spine Wo Contrast  Result Date: 12/20/2022 CLINICAL DATA:  Neck trauma (Age >= 65y); Head trauma, minor (Age >= 65y) EXAM: CT HEAD WITHOUT CONTRAST CT CERVICAL SPINE WITHOUT CONTRAST TECHNIQUE: Multidetector CT imaging of the head  and cervical spine was performed following the standard protocol without intravenous contrast. Multiplanar CT image reconstructions of the cervical spine were also generated. RADIATION DOSE REDUCTION: This exam was performed according to the departmental dose-optimization program which includes automated exposure control, adjustment of the mA and/or kV according to patient size and/or use of iterative reconstruction technique. COMPARISON:  CT head 07/09/2020. FINDINGS: CT HEAD FINDINGS Brain: No evidence of acute infarction, hemorrhage, hydrocephalus, extra-axial collection or mass lesion/mass effect. Vascular: No hyperdense vessel. Skull: No acute fracture. Sinuses/Orbits: Clear sinuses.  No acute orbital findings. Other: No mastoid effusions. CT CERVICAL SPINE FINDINGS Alignment: No substantial sagittal subluxation. Skull base and vertebrae: Vertebral body heights are maintained. No evidence of acute fracture. Soft tissues and spinal canal: No prevertebral fluid or swelling. No visible canal hematoma. Disc levels: Moderate multilevel degenerative change, including degenerative disc disease, endplate spurring, facet and uncovertebral hypertrophy and varying degrees of neural foraminal stenosis. Probably severe right foraminal stenosis at C5-C6. Upper chest: Visualized lung apices are clear. IMPRESSION: 1. No evidence of acute intracranial abnormality. 2. No evidence of acute fracture or traumatic malalignment in the cervical spine. 3. Multilevel degenerative change, including probably severe right foraminal stenosis C5-C6. MRI of the cervical spine could further characterize if clinically warranted. Electronically Signed   By: Margaretha Sheffield M.D.   On: 12/20/2022 16:08   DG Chest Portable 1 View  Result Date: 12/20/2022 CLINICAL DATA:  Weakness since last night, fell EXAM: PORTABLE CHEST 1 VIEW COMPARISON:  02/15/2015 FINDINGS: Single frontal view of the chest demonstrates an unremarkable cardiac silhouette.  No airspace disease, effusion, or pneumothorax. No acute bony abnormality. IMPRESSION: 1. No acute intrathoracic process. Electronically Signed   By: Randa Ngo M.D.   On: 12/20/2022 15:38   CT HEAD WO CONTRAST  Result Date: 12/20/2022 CLINICAL DATA:  Fall with head strike EXAM: CT HEAD  WITHOUT CONTRAST TECHNIQUE: Contiguous axial images were obtained from the base of the skull through the vertex without intravenous contrast. RADIATION DOSE REDUCTION: This exam was performed according to the departmental dose-optimization program which includes automated exposure control, adjustment of the mA and/or kV according to patient size and/or use of iterative reconstruction technique. COMPARISON:  CT head 07/09/2020 FINDINGS: Brain: There is no acute intracranial hemorrhage, extra-axial fluid collection, or acute infarct. Parenchymal volume is normal. The ventricles are normal in size. Gray-white differentiation is preserved. There is hypodensity in the region of the right basal ganglia which is nonspecific but likely reflects sequela of mild chronic small-vessel ischemic change. There is no mass lesion. There is no mass effect or midline shift. The pituitary and suprasellar region are normal. Vascular: No hyperdense vessel or unexpected calcification. Skull: Normal. Negative for fracture or focal lesion. Sinuses/Orbits: The imaged paranasal sinuses are clear. Bilateral lens implants are in place. The globes and orbits are otherwise unremarkable. Other: None. IMPRESSION: No acute intracranial pathology. Electronically Signed   By: Valetta Mole M.D.   On: 12/20/2022 14:03    Microbiology: Results for orders placed or performed during the hospital encounter of 12/20/22  Resp panel by RT-PCR (RSV, Flu A&B, Covid)     Status: Abnormal   Collection Time: 12/20/22  3:09 PM   Specimen: Nasal Swab  Result Value Ref Range Status   SARS Coronavirus 2 by RT PCR POSITIVE (A) NEGATIVE Final    Comment:  (NOTE) SARS-CoV-2 target nucleic acids are DETECTED.  The SARS-CoV-2 RNA is generally detectable in upper respiratory specimens during the acute phase of infection. Positive results are indicative of the presence of the identified virus, but do not rule out bacterial infection or co-infection with other pathogens not detected by the test. Clinical correlation with patient history and other diagnostic information is necessary to determine patient infection status. The expected result is Negative.  Fact Sheet for Patients: EntrepreneurPulse.com.au  Fact Sheet for Healthcare Providers: IncredibleEmployment.be  This test is not yet approved or cleared by the [place] FDA and  has been authorized for detection and/or diagnosis of SARS-CoV-2 by FDA under an Emergency Use Authorization (EUA).  This EUA will remain in effect (meaning this test can be used) for the duration of  the COVID-19 declaration under Section 564(b)(1) of the A ct, 21 U.S.C. section 360bbb-3(b)(1), unless the authorization is terminated or revoked sooner.     Influenza A by PCR NEGATIVE NEGATIVE Final   Influenza B by PCR NEGATIVE NEGATIVE Final    Comment: (NOTE) The Xpert Xpress SARS-CoV-2/FLU/RSV plus assay is intended as an aid in the diagnosis of influenza from Nasopharyngeal swab specimens and should not be used as a sole basis for treatment. Nasal washings and aspirates are unacceptable for Xpert Xpress SARS-CoV-2/FLU/RSV testing.  Fact Sheet for Patients: EntrepreneurPulse.com.au  Fact Sheet for Healthcare Providers: IncredibleEmployment.be  This test is not yet approved or cleared by the [place] FDA and has been authorized for detection and/or diagnosis of SARS-CoV-2 by FDA under an Emergency Use Authorization (EUA). This EUA will remain in effect (meaning this test can be used) for the duration of the COVID-19  declaration under Section 564(b)(1) of the Act, 21 U.S.C. section 360bbb-3(b)(1), unless the authorization is terminated or revoked.     Resp Syncytial Virus by PCR NEGATIVE NEGATIVE Final    Comment: (NOTE) Fact Sheet for Patients: EntrepreneurPulse.com.au  Fact Sheet for Healthcare Providers: IncredibleEmployment.be  This test is not yet approved or cleared  by the [place] and has been authorized for detection and/or diagnosis of SARS-CoV-2 by FDA under an Emergency Use Authorization (EUA). This EUA will remain in effect (meaning this test can be used) for the duration of the COVID-19 declaration under Section 564(b)(1) of the Act, 21 U.S.C. section 360bbb-3(b)(1), unless the authorization is terminated or revoked.  Performed at [facility], [address] [place], [place] [address]     Labs: CBC: Recent Labs  Lab 12/20/22 1345 12/21/22 0424  WBC 8.5 7.4  HGB 14.9 14.2  HCT 44.7 41.9  MCV 88.3 87.7  PLT 109* 886*   Basic Metabolic Panel: Recent Labs  Lab 12/20/22 1345 12/21/22 0424  NA 134* 137  K 3.6 3.7  CL 102 104  CO2 22 24  GLUCOSE 106* 92  BUN 12 13  CREATININE 1.23 1.12  CALCIUM 8.5* 8.6*   Liver Function Tests: Recent Labs  Lab 12/21/22 0424  AST 22  ALT 15  ALKPHOS 53  BILITOT 1.2  PROT 7.1  ALBUMIN 3.6   CBG: Recent Labs  Lab 12/20/22 1344  GLUCAP 110*    Discharge time spent: greater than 30 minutes.  Signed: Loletha Grayer, MD [facility] 12/24/2022

## 2023-01-11 ENCOUNTER — Other Ambulatory Visit: Payer: Self-pay | Admitting: General Surgery

## 2023-01-16 LAB — SURGICAL PATHOLOGY

## 2023-02-22 ENCOUNTER — Other Ambulatory Visit: Payer: Self-pay | Admitting: General Surgery

## 2023-02-26 LAB — SURGICAL PATHOLOGY

## 2023-04-16 ENCOUNTER — Ambulatory Visit: Payer: Medicare Other

## 2023-04-16 DIAGNOSIS — R634 Abnormal weight loss: Secondary | ICD-10-CM | POA: Diagnosis not present

## 2023-04-16 DIAGNOSIS — K5909 Other constipation: Secondary | ICD-10-CM | POA: Diagnosis present

## 2023-04-16 DIAGNOSIS — K5730 Diverticulosis of large intestine without perforation or abscess without bleeding: Secondary | ICD-10-CM | POA: Diagnosis not present

## 2023-04-16 DIAGNOSIS — R933 Abnormal findings on diagnostic imaging of other parts of digestive tract: Secondary | ICD-10-CM | POA: Diagnosis not present

## 2023-04-16 DIAGNOSIS — K640 First degree hemorrhoids: Secondary | ICD-10-CM | POA: Diagnosis not present

## 2023-10-24 ENCOUNTER — Other Ambulatory Visit: Payer: Self-pay | Admitting: Physician Assistant

## 2023-10-24 DIAGNOSIS — R1084 Generalized abdominal pain: Secondary | ICD-10-CM

## 2023-10-29 ENCOUNTER — Other Ambulatory Visit: Payer: Self-pay | Admitting: Physician Assistant

## 2023-10-29 ENCOUNTER — Ambulatory Visit
Admission: RE | Admit: 2023-10-29 | Discharge: 2023-10-29 | Disposition: A | Discharge status: Home / Self Care | Payer: Medicare Other | Source: Ambulatory Visit | Attending: Physician Assistant | Admitting: Physician Assistant

## 2023-10-29 DIAGNOSIS — R1084 Generalized abdominal pain: Secondary | ICD-10-CM

## 2023-10-29 MED ORDER — IOHEXOL 350 MG/ML SOLN
100.0000 mL | Freq: Once | INTRAVENOUS | Status: AC | PRN
  Administered 2023-10-29: 100 mL via INTRAVENOUS

## 2023-11-22 ENCOUNTER — Ambulatory Visit: Payer: Self-pay | Admitting: General Surgery

## 2023-11-22 NOTE — H&P (Signed)
PATIENT PROFILE: Alejandro Wall is a 67 y.o. male who presents to the Clinic for consultation at the request of Dr. Hyacinth Meeker for evaluation of bilateral inguinal hernia.  PCP:  Carlynn Purl, MD  HISTORY OF PRESENT ILLNESS: Mr. Wynder reports he has been complaining of right groin pain.  Pain aggravated by straining.  No alleviating factors.  Pain does not radiate to other part of body.  Patient denies any episode of abdominal distention, nausea or vomiting.  Patient is having bowel movement.  Patient had a CT scan of the abdomen and pelvis done in January that shows small bilateral fat-containing inguinal hernias.  I personally evaluated the images.  Fatty inguinal hernia bigger on the right side and on the left side.  No sign of obstruction.  Patient has history of left inguinal hernia repair.   PROBLEM LIST: Problem List  Date Reviewed: 10/19/2021          Noted   Chronic ITP (idiopathic thrombocytopenia) (CMS/HHS-HCC) 03/29/2021   Hyperlipidemia, mixed 09/22/2019   Medicare annual wellness visit, initial 09/18/2018   Overview     5/23, 5/24      Moderate episode of recurrent major depressive disorder (CMS/HHS-HCC) 02/27/2018   B12 deficiency 09/17/2017   Overview    230, 2018      Benign essential hypertension 03/20/2017   CVD (cerebrovascular disease) 03/28/2016   Overview    3/16 TIA, pons ischemia by MRI, carotid negative      Multi-infarct dementia, without behavioral disturbance (CMS/HHS-HCC) 03/28/2016   Bilateral chronic knee pain 02/07/2016   Benign non-nodular prostatic hyperplasia with lower urinary tract symptoms 10/20/2015    GENERAL REVIEW OF SYSTEMS:   General ROS: negative for - chills, fatigue, fever, weight gain or weight loss Allergy and Immunology ROS: negative for - hives  Hematological and Lymphatic ROS: negative for - bleeding problems or bruising, negative for palpable nodes Endocrine ROS: negative for - heat or cold intolerance, hair  changes Respiratory ROS: negative for - cough, shortness of breath or wheezing Cardiovascular ROS: no chest pain or palpitations GI ROS: negative for nausea, vomiting positive for abdominal pain Musculoskeletal ROS: negative for - joint swelling or muscle pain Neurological ROS: negative for - confusion, syncope Dermatological ROS: negative for pruritus and rash Psychiatric: negative for anxiety, depression, difficulty sleeping and memory loss  MEDICATIONS: Current Outpatient Medications  Medication Sig Dispense Refill   aspirin 81 MG EC tablet Take 81 mg by mouth once daily.     cetirizine (ZYRTEC) 10 MG tablet Take by mouth     clonazePAM (KLONOPIN) 0.5 MG tablet TAKE ONE TABLET BY MOUTH FOUR TIMES A DAY 120 tablet 5   co-enzyme Q-10, ubiquinone, (CO Q-10) 100 mg capsule Take 100 mg by mouth once daily 400 mg     cyanocobalamin/cobamamide (B12 SL) Take by mouth 1000 mcg tablet daily orally by mouth     omeprazole (PRILOSEC) 40 MG DR capsule Take 1 capsule (40 mg total) by mouth once daily 90 capsule 3   rosuvastatin (CRESTOR) 5 MG tablet Take 1 tablet (5 mg total) by mouth once daily 90 tablet 3   traZODone (DESYREL) 100 MG tablet Take 1 tablet (100 mg total) by mouth at bedtime 90 tablet 3   No current facility-administered medications for this visit.    ALLERGIES: Venlafaxine and Abilify [aripiprazole]  PAST MEDICAL HISTORY: Past Medical History:  Diagnosis Date   Anxiety    Chronic ITP (idiopathic thrombocytopenia) (CMS/HHS-HCC) 03/29/2021   CVD (cerebrovascular disease) 03/28/2016  3/16 TIA, pons ischemia by MRI   Depression    GERD (gastroesophageal reflux disease)    History of cancer    History of stroke    Hyperlipidemia    Hypertension     PAST SURGICAL HISTORY: Past Surgical History:  Procedure Laterality Date   COLONOSCOPY  04/16/2023   Abnormal CT/Repeat 30yrs/TKT   CATARACT EXTRACTION     HERNIA REPAIR       FAMILY HISTORY: No family history on file.    SOCIAL HISTORY: Social History   Socioeconomic History   Marital status: Single  Tobacco Use   Smoking status: Former    Current packs/day: 0.00    Types: Cigarettes    Quit date: 10/26/2016    Years since quitting: 7.0   Smokeless tobacco: Never  Vaping Use   Vaping status: Never Used  Substance and Sexual Activity   Alcohol use: No    Alcohol/week: 0.0 standard drinks of alcohol   Drug use: Never   Sexual activity: Defer  Social History Narrative   Retired.   Social Drivers of Corporate investment banker Strain: Low Risk  (11/05/2023)   Overall Financial Resource Strain (CARDIA)    Difficulty of Paying Living Expenses: Not hard at all  Recent Concern: Financial Resource Strain - Medium Risk (10/31/2023)   Overall Financial Resource Strain (CARDIA)    Difficulty of Paying Living Expenses: Somewhat hard  Food Insecurity: No Food Insecurity (11/05/2023)   Hunger Vital Sign    Worried About Running Out of Food in the Last Year: Never true    Ran Out of Food in the Last Year: Never true  Recent Concern: Food Insecurity - Food Insecurity Present (10/31/2023)   Hunger Vital Sign    Worried About Running Out of Food in the Last Year: Sometimes true    Ran Out of Food in the Last Year: Sometimes true  Transportation Needs: No Transportation Needs (11/05/2023)   PRAPARE - Administrator, Civil Service (Medical): No    Lack of Transportation (Non-Medical): No    PHYSICAL EXAM: Vitals:   11/22/23 1027  BP: (!) 151/74  Pulse: (!) 48   Body mass index is 26.01 kg/m. Weight: 87 kg (191 lb 12.8 oz)   GENERAL: Alert, active, oriented x3  HEENT: Pupils equal reactive to light. Extraocular movements are intact. Sclera clear. Palpebral conjunctiva normal red color.Pharynx clear.  NECK: Supple with no palpable mass and no adenopathy.  LUNGS: Sound clear with no rales rhonchi or wheezes.  HEART: Regular rhythm S1 and S2 without murmur.  ABDOMEN: Soft and  depressible, nontender with no palpable mass, no hepatomegaly.  Bilateral weakness of the inguinal canal obvious bulging on the right groin, reducible.  EXTREMITIES: Well-developed well-nourished symmetrical with no dependent edema.  NEUROLOGICAL: Awake alert oriented, facial expression symmetrical, moving all extremities.  REVIEW OF DATA: I have reviewed the following data today: Appointment on 10/29/2023  Component Date Value   WBC (White Blood Cell Co* 10/29/2023 5.1    RBC (Red Blood Cell Coun* 10/29/2023 5.22    Hemoglobin 10/29/2023 16.4    Hematocrit 10/29/2023 47.4    MCV (Mean Corpuscular Vo* 10/29/2023 90.8    MCH (Mean Corpuscular He* 10/29/2023 31.4 (H)    MCHC (Mean Corpuscular H* 10/29/2023 34.6    Platelet Count 10/29/2023 104 (L)    RDW-CV (Red Cell Distrib* 10/29/2023 15.4 (H)    MPV (Mean Platelet Volum* 10/29/2023 11.7    Neutrophils 10/29/2023 3.13  Lymphocytes 10/29/2023 1.58    Monocytes 10/29/2023 0.36    Eosinophils 10/29/2023 0.02    Basophils 10/29/2023 0.03    Neutrophil % 10/29/2023 61.0    Lymphocyte % 10/29/2023 30.8    Monocyte % 10/29/2023 7.0    Eosinophil % 10/29/2023 0.4 (L)    Basophil% 10/29/2023 0.6    Immature Granulocyte % 10/29/2023 0.2    Immature Granulocyte Cou* 10/29/2023 0.01    Glucose 10/29/2023 92    Sodium 10/29/2023 145    Potassium 10/29/2023 4.7    Chloride 10/29/2023 112 (H)    Carbon Dioxide (CO2) 10/29/2023 27.7    Urea Nitrogen (BUN) 10/29/2023 17    Creatinine 10/29/2023 1.1    Glomerular Filtration Ra* 10/29/2023 73    Calcium 10/29/2023 8.9    AST  10/29/2023 14    ALT  10/29/2023 13    Alk Phos (alkaline Phosp* 10/29/2023 81    Albumin 10/29/2023 3.9    Bilirubin, Total 10/29/2023 0.4    Protein, Total 10/29/2023 6.0 (L)    A/G Ratio 10/29/2023 1.9    Cholesterol, Total 10/29/2023 124    Triglyceride 10/29/2023 122    HDL (High Density Lipopr* 40/98/1191 40.0    LDL Calculated 10/29/2023 60    VLDL  Cholesterol 10/29/2023 24    Cholesterol/HDL Ratio 10/29/2023 3.1    Vitamin B12 10/29/2023 454    Color 10/29/2023 Light Yellow    Clarity 10/29/2023 Clear    Specific Gravity 10/29/2023 1.019    pH, Urine 10/29/2023 6.0    Protein, Urinalysis 10/29/2023 Negative    Glucose, Urinalysis 10/29/2023 Negative    Ketones, Urinalysis 10/29/2023 Negative    Blood, Urinalysis 10/29/2023 Negative    Nitrite, Urinalysis 10/29/2023 Negative    Leukocyte Esterase, Urin* 10/29/2023 Negative    Bilirubin, Urinalysis 10/29/2023 Negative    Urobilinogen, Urinalysis 10/29/2023 2.0 (H)    WBC, UA 10/29/2023 <1    Red Blood Cells, Urinaly* 10/29/2023 1    Bacteria, Urinalysis 10/29/2023 0-5    Squamous Epithelial Cell* 10/29/2023 0    Thyroid Stimulating Horm* 10/29/2023 2.929    PSA (Prostate Specific A* 10/29/2023 0.15   Same Day Visit on 10/22/2023  Component Date Value   WBC (White Blood Cell Co* 10/22/2023 9.4    RBC (Red Blood Cell Coun* 10/22/2023 5.72    Hemoglobin 10/22/2023 17.9    Hematocrit 10/22/2023 51.5    MCV (Mean Corpuscular Vo* 10/22/2023 90.0    MCH (Mean Corpuscular He* 10/22/2023 31.3 (H)    MCHC (Mean Corpuscular H* 10/22/2023 34.8    Platelet Count 10/22/2023 113 (L)    RDW-CV (Red Cell Distrib* 10/22/2023 16.0 (H)    MPV (Mean Platelet Volum* 10/22/2023 12.2    Neutrophils 10/22/2023 7.33    Lymphocytes 10/22/2023 1.46    Monocytes 10/22/2023 0.52    Eosinophils 10/22/2023 0.02    Basophils 10/22/2023 0.03    Neutrophil % 10/22/2023 78.0 (H)    Lymphocyte % 10/22/2023 15.5    Monocyte % 10/22/2023 5.5    Eosinophil % 10/22/2023 0.2 (L)    Basophil% 10/22/2023 0.3    Immature Granulocyte % 10/22/2023 0.5    Immature Granulocyte Cou* 10/22/2023 0.05    Glucose 10/22/2023 116 (H)    Sodium 10/22/2023 141    Potassium 10/22/2023 4.4    Chloride 10/22/2023 107    Carbon Dioxide (CO2) 10/22/2023 30.3    Urea Nitrogen (BUN) 10/22/2023 12    Creatinine 10/22/2023 1.0     Glomerular Filtration  Ra* 10/22/2023 82    Calcium 10/22/2023 9.0    AST  10/22/2023 13    ALT  10/22/2023 9    Alk Phos (alkaline Phosp* 10/22/2023 75    Albumin 10/22/2023 3.9    Bilirubin, Total 10/22/2023 0.8    Protein, Total 10/22/2023 6.5    A/G Ratio 10/22/2023 1.5    Lipase 10/22/2023 9 (L)    Color 10/22/2023 Yellow    Clarity 10/22/2023 Clear    Specific Gravity 10/22/2023 1.027    pH, Urine 10/22/2023 6.5    Protein, Urinalysis 10/22/2023 Trace (!)    Glucose, Urinalysis 10/22/2023 Negative    Ketones, Urinalysis 10/22/2023 Negative    Blood, Urinalysis 10/22/2023 Negative    Nitrite, Urinalysis 10/22/2023 Negative    Leukocyte Esterase, Urin* 10/22/2023 Negative    Bilirubin, Urinalysis 10/22/2023 Negative    Urobilinogen, Urinalysis 10/22/2023 6.0 (H)    Mucous, Urine 10/22/2023 PRESENT (!)    WBC, UA 10/22/2023 <1    Red Blood Cells, Urinaly* 10/22/2023 3    Bacteria, Urinalysis 10/22/2023 0-5    Squamous Epithelial Cell* 10/22/2023 0      ASSESSMENT: Mr. Eadie is a 67 y.o. male presenting for consultation for bilateral inguinal hernia more symptomatic on the right than the left.    The patient presents with a symptomatic, reducible bilateral inguinal hernia.  Left inguinal hernia is recurrent.  Regular hernia is primary.  Patient was oriented about the diagnosis of inguinal hernia and its implication. The patient was oriented about the treatment alternatives (observation vs surgical repair). Due to patient symptoms, repair is recommended. Patient oriented about the surgical procedure, the use of mesh and its risk of complications such as: infection, bleeding, injury to vas deference, vasculature and testicle, injury to bowel or bladder, and chronic pain.   Offered to have surgery next week but due to availability of help, he will like to wait until after the new year for the surgery.  Bilateral recurrent inguinal hernia without obstruction or gangrene  [K40.21]  PLAN: 1.  Robotic assisted laparoscopic bilateral inguinal hernia repair with mesh ([id number], [id number]) 2.  Avoid taking aspirin or any blood thinner 5 days before the surgery 3.  Contact us if you have any concern  Patient verbalized understanding, all questions were answered, and were agreeable with the plan outlined above.

## 2023-12-03 ENCOUNTER — Encounter
Admission: RE | Admit: 2023-12-03 | Discharge: 2023-12-03 | Disposition: A | Discharge status: Home / Self Care | Payer: Medicare Other | Source: Ambulatory Visit | Attending: General Surgery | Admitting: General Surgery

## 2023-12-03 ENCOUNTER — Other Ambulatory Visit: Payer: Self-pay

## 2023-12-03 HISTORY — DX: Hypo-osmolality and hyponatremia: E87.1

## 2023-12-03 HISTORY — DX: Gastro-esophageal reflux disease without esophagitis: K21.9

## 2023-12-03 HISTORY — DX: Anxiety disorder, unspecified: F41.9

## 2023-12-03 HISTORY — DX: Do not resuscitate: Z66

## 2023-12-03 HISTORY — DX: Depression, unspecified: F32.A

## 2023-12-03 HISTORY — DX: Unspecified osteoarthritis, unspecified site: M19.90

## 2023-12-03 HISTORY — DX: Spinal stenosis, cervical region: M48.02

## 2023-12-03 HISTORY — DX: Thrombocytopenia, unspecified: D69.6

## 2023-12-03 HISTORY — DX: Solitary pulmonary nodule: R91.1

## 2023-12-03 NOTE — Patient Instructions (Addendum)
Your procedure is scheduled on:  Friday January 3  Report to the Registration Desk on the 1st floor of the [facility] To find out your arrival time, please call [phone] between 1PM - 3PM on:  Thursday January 2 If your arrival time is 6:00 am, do not arrive before that time as the [facility] entrance doors do not open until 6:00 am.  REMEMBER: Instructions that are not followed completely may result in serious medical risk, up to and including death; or upon the discretion of your surgeon and anesthesiologist your surgery may need to be rescheduled.  Do not eat food after midnight the night before surgery.  No gum chewing or hard candies.   One week prior to surgery:Friday December 27 Stop Anti-inflammatories (NSAIDS) such as Advil, Aleve, Ibuprofen, Motrin, Naproxen, Naprosyn and Aspirin based products such as Excedrin, Goody's Powder, BC Powder. Stop ANY OVER THE COUNTER supplements until after surgery. Coenzyme Q10 (COQ10)   You may however, continue to take Tylenol if needed for pain up until the day of surgery.  Continue taking all of your other prescription medications up until the day of surgery.  ON THE DAY OF SURGERY ONLY TAKE THESE MEDICATIONS WITH SIPS OF WATER:  clonazePAM (KLONOPIN)  cyanocobalamin (VITAMIN B12)  omeprazole (PRILOSEC)  rosuvastatin (CRESTOR)    No Alcohol for 24 hours before or after surgery.  No Smoking including e-cigarettes for 24 hours before surgery.  No chewable tobacco products for at least 6 hours before surgery.  No nicotine patches on the day of surgery.  Do not use any "recreational" drugs for at least a week (preferably 2 weeks) before your surgery.  Please be advised that the combination of cocaine and anesthesia may have negative outcomes, up to and including death. If you test positive for cocaine, your surgery will be cancelled.  On the morning of surgery brush your teeth with toothpaste and water, you may rinse your  mouth with mouthwash if you wish. Do not swallow any toothpaste or mouthwash.  Use CHG Soap as directed on instruction sheet.  Do not wear jewelry, make-up, hairpins, clips or nail polish.  For welded (permanent) jewelry: bracelets, anklets, waist bands, etc.  Please have this removed prior to surgery.  If it is not removed, there is a chance that hospital personnel will need to cut it off on the day of surgery.  Do not wear lotions, powders, or perfumes.   Do not shave body hair from the neck down 48 hours before surgery.  Contact lenses, hearing aids and dentures may not be worn into surgery.  Do not bring valuables to the hospital. [facility] is not responsible for any missing/lost belongings or valuables.   Notify your doctor if there is any change in your medical condition (cold, fever, infection).  Wear comfortable clothing (specific to your surgery type) to the hospital.  After surgery, you can help prevent lung complications by doing breathing exercises.  Take deep breaths and cough every 1-2 hours.   When coughing or sneezing, hold a pillow firmly against your incision with both hands. This is called "splinting." Doing this helps protect your incision. It also decreases belly discomfort.  If you are being discharged the day of surgery, you will not be allowed to drive home. You will need a responsible individual to drive you home and stay with you for 24 hours after surgery.   If you are taking public transportation, you will need to have a responsible individual with  you.  Please call the Pre-admissions Testing Dept. at [phone] if you have any questions about these instructions.  Surgery Visitation Policy:  Patients having surgery or a procedure may have two visitors.  Children under the age of 4 must have an adult with them who is not the patient.              Preparing for Surgery with CHLORHEXIDINE GLUCONATE (CHG) Soap  Chlorhexidine Gluconate  (CHG) Soap  o An antiseptic cleaner that kills germs and bonds with the skin to continue killing germs even after washing  o Used for showering the night before surgery and morning of surgery  Before surgery, you can play an important role by reducing the number of germs on your skin.  CHG (Chlorhexidine gluconate) soap is an antiseptic cleanser which kills germs and bonds with the skin to continue killing germs even after washing.  Please do not use if you have an allergy to CHG or antibacterial soaps. If your skin becomes reddened/irritated stop using the CHG.  1. Shower the NIGHT BEFORE SURGERY and the MORNING OF SURGERY with CHG soap.  2. If you choose to wash your hair, wash your hair first as usual with your normal shampoo.  3. After shampooing, rinse your hair and body thoroughly to remove the shampoo.  4. Use CHG as you would any other liquid soap. You can apply CHG directly to the skin and wash gently with a scrungie or a clean washcloth.  5. Apply the CHG soap to your body only from the neck down. Do not use on open wounds or open sores. Avoid contact with your eyes, ears, mouth, and genitals (private parts). Wash face and genitals (private parts) with your normal soap.  6. Wash thoroughly, paying special attention to the area where your surgery will be performed.  7. Thoroughly rinse your body with warm water.  8. Do not shower/wash with your normal soap after using and rinsing off the CHG soap.  9. Pat yourself dry with a clean towel.  10. Wear clean pajamas to bed the night before surgery.  12. Place clean sheets on your bed the night of your first shower and do not sleep with pets.  13. Shower again with the CHG soap on the day of surgery prior to arriving at the hospital.  14. Do not apply any deodorants/lotions/powders.  15. Please wear clean clothes to the hospital.

## 2023-12-06 MED ORDER — CHLORHEXIDINE GLUCONATE 0.12 % MT SOLN
15.0000 mL | Freq: Once | OROMUCOSAL | Status: AC
  Administered 2023-12-07: 15 mL via OROMUCOSAL

## 2023-12-06 MED ORDER — CEFAZOLIN SODIUM-DEXTROSE 2-4 GM/100ML-% IV SOLN
2.0000 g | INTRAVENOUS | Status: AC
  Administered 2023-12-07: 2 g via INTRAVENOUS

## 2023-12-06 MED ORDER — ORAL CARE MOUTH RINSE: 15.0000 mL | Freq: Once | OROMUCOSAL | Status: AC

## 2023-12-06 MED ORDER — LACTATED RINGERS IV SOLN
INTRAVENOUS | Status: DC
Start: 2023-12-06 — End: 2023-12-07

## 2023-12-07 ENCOUNTER — Other Ambulatory Visit: Payer: Self-pay

## 2023-12-07 ENCOUNTER — Ambulatory Visit: Payer: Medicare Other | Admitting: Anesthesiology

## 2023-12-07 ENCOUNTER — Encounter: Payer: Self-pay | Admitting: General Surgery

## 2023-12-07 ENCOUNTER — Encounter
Admission: RE | Disposition: A | Discharge status: Home / Self Care | Payer: Self-pay | Source: Home / Self Care | Attending: General Surgery

## 2023-12-07 ENCOUNTER — Ambulatory Visit
Admission: RE | Admit: 2023-12-07 | Discharge: 2023-12-07 | Disposition: A | Discharge status: Home / Self Care | Payer: Medicare Other | Attending: General Surgery | Admitting: General Surgery

## 2023-12-07 DIAGNOSIS — F419 Anxiety disorder, unspecified: Secondary | ICD-10-CM | POA: Diagnosis not present

## 2023-12-07 DIAGNOSIS — Z8673 Personal history of transient ischemic attack (TIA), and cerebral infarction without residual deficits: Secondary | ICD-10-CM | POA: Diagnosis not present

## 2023-12-07 DIAGNOSIS — K4091 Unilateral inguinal hernia, without obstruction or gangrene, recurrent: Secondary | ICD-10-CM | POA: Insufficient documentation

## 2023-12-07 DIAGNOSIS — F32A Depression, unspecified: Secondary | ICD-10-CM | POA: Insufficient documentation

## 2023-12-07 DIAGNOSIS — I10 Essential (primary) hypertension: Secondary | ICD-10-CM | POA: Diagnosis not present

## 2023-12-07 DIAGNOSIS — K219 Gastro-esophageal reflux disease without esophagitis: Secondary | ICD-10-CM | POA: Insufficient documentation

## 2023-12-07 DIAGNOSIS — F17290 Nicotine dependence, other tobacco product, uncomplicated: Secondary | ICD-10-CM | POA: Insufficient documentation

## 2023-12-07 DIAGNOSIS — K4090 Unilateral inguinal hernia, without obstruction or gangrene, not specified as recurrent: Secondary | ICD-10-CM | POA: Insufficient documentation

## 2023-12-07 DIAGNOSIS — Z5986 Financial insecurity: Secondary | ICD-10-CM | POA: Diagnosis not present

## 2023-12-07 SURGERY — REPAIR, HERNIA, INGUINAL, BILATERAL, ROBOT-ASSISTED
Anesthesia: General | Site: Inguinal | Laterality: Bilateral

## 2023-12-07 MED ORDER — 0.9 % SODIUM CHLORIDE (POUR BTL) OPTIME
TOPICAL | Status: DC | PRN
  Administered 2023-12-07: 500 mL

## 2023-12-07 MED ORDER — OXYCODONE HCL 5 MG/5ML PO SOLN
5.0000 mg | Freq: Once | ORAL | Status: AC | PRN
Start: 2023-12-07 — End: 2023-12-07

## 2023-12-07 MED ORDER — FENTANYL CITRATE (PF) 100 MCG/2ML IJ SOLN
INTRAMUSCULAR | Status: DC | PRN
  Administered 2023-12-07 (×2): 50 ug via INTRAVENOUS

## 2023-12-07 MED ORDER — FIBRIN SEALANT 2 ML SINGLE DOSE KIT
PACK | CUTANEOUS | Status: AC
  Filled 2023-12-07: qty 2

## 2023-12-07 MED ORDER — EPHEDRINE SULFATE-NACL 50-0.9 MG/10ML-% IV SOSY
PREFILLED_SYRINGE | INTRAVENOUS | Status: DC | PRN
  Administered 2023-12-07: 5 mg via INTRAVENOUS

## 2023-12-07 MED ORDER — OXYCODONE HCL 5 MG PO TABS
ORAL_TABLET | ORAL | Status: AC
  Filled 2023-12-07: qty 1

## 2023-12-07 MED ORDER — DEXAMETHASONE SODIUM PHOSPHATE 10 MG/ML IJ SOLN
INTRAMUSCULAR | Status: DC | PRN
  Administered 2023-12-07: 10 mg via INTRAVENOUS

## 2023-12-07 MED ORDER — PROPOFOL 10 MG/ML IV BOLUS
INTRAVENOUS | Status: DC | PRN
  Administered 2023-12-07: 150 mg via INTRAVENOUS

## 2023-12-07 MED ORDER — MIDAZOLAM HCL 2 MG/2ML IJ SOLN
INTRAMUSCULAR | Status: AC
  Filled 2023-12-07: qty 2

## 2023-12-07 MED ORDER — LIDOCAINE HCL (CARDIAC) PF 100 MG/5ML IV SOSY
PREFILLED_SYRINGE | INTRAVENOUS | Status: DC | PRN
  Administered 2023-12-07: 100 mg via INTRAVENOUS

## 2023-12-07 MED ORDER — ACETAMINOPHEN 10 MG/ML IV SOLN
INTRAVENOUS | Status: DC | PRN
  Administered 2023-12-07: 1000 mg via INTRAVENOUS

## 2023-12-07 MED ORDER — BUPIVACAINE-EPINEPHRINE 0.25% -1:200000 IJ SOLN
INTRAMUSCULAR | Status: DC | PRN
  Administered 2023-12-07: 30 mL

## 2023-12-07 MED ORDER — HYDROCODONE-ACETAMINOPHEN 5-325 MG PO TABS: 1.0000 | ORAL_TABLET | ORAL | 0 refills | Status: AC | PRN

## 2023-12-07 MED ORDER — CHLORHEXIDINE GLUCONATE 0.12 % MT SOLN
OROMUCOSAL | Status: AC
  Filled 2023-12-07: qty 15

## 2023-12-07 MED ORDER — DEXMEDETOMIDINE HCL IN NACL 80 MCG/20ML IV SOLN
INTRAVENOUS | Status: DC | PRN
  Administered 2023-12-07: 12 ug via INTRAVENOUS
  Administered 2023-12-07: 8 ug via INTRAVENOUS

## 2023-12-07 MED ORDER — OXYCODONE HCL 5 MG PO TABS
5.0000 mg | ORAL_TABLET | Freq: Once | ORAL | Status: AC | PRN
  Administered 2023-12-07: 5 mg via ORAL

## 2023-12-07 MED ORDER — ONDANSETRON HCL 4 MG/2ML IJ SOLN: 4.0000 mg | Freq: Once | INTRAMUSCULAR | Status: DC | PRN

## 2023-12-07 MED ORDER — BUPIVACAINE-EPINEPHRINE (PF) 0.25% -1:200000 IJ SOLN
INTRAMUSCULAR | Status: AC
  Filled 2023-12-07: qty 30

## 2023-12-07 MED ORDER — FENTANYL CITRATE (PF) 100 MCG/2ML IJ SOLN
INTRAMUSCULAR | Status: AC
  Filled 2023-12-07: qty 2

## 2023-12-07 MED ORDER — SUGAMMADEX SODIUM 200 MG/2ML IV SOLN
INTRAVENOUS | Status: DC | PRN
  Administered 2023-12-07: 200 mg via INTRAVENOUS

## 2023-12-07 MED ORDER — VISTASEAL 4 ML SINGLE DOSE KIT
PACK | CUTANEOUS | Status: AC
  Filled 2023-12-07: qty 4

## 2023-12-07 MED ORDER — PROPOFOL 10 MG/ML IV BOLUS
INTRAVENOUS | Status: AC
  Filled 2023-12-07: qty 20

## 2023-12-07 MED ORDER — LACTATED RINGERS IV SOLN
INTRAVENOUS | Status: AC
Start: 2023-12-07 — End: 2023-12-07

## 2023-12-07 MED ORDER — HEMOSTATIC AGENTS (NO CHARGE) OPTIME
TOPICAL | Status: DC | PRN
Start: 2023-12-07 — End: 2023-12-07
  Administered 2023-12-07: 1 via TOPICAL

## 2023-12-07 MED ORDER — CEFAZOLIN SODIUM-DEXTROSE 2-4 GM/100ML-% IV SOLN
INTRAVENOUS | Status: AC
  Filled 2023-12-07: qty 100

## 2023-12-07 MED ORDER — FENTANYL CITRATE (PF) 100 MCG/2ML IJ SOLN
25.0000 ug | INTRAMUSCULAR | Status: DC | PRN
  Administered 2023-12-07 (×2): 25 ug via INTRAVENOUS

## 2023-12-07 MED ORDER — KETAMINE HCL 50 MG/5ML IJ SOSY
PREFILLED_SYRINGE | INTRAMUSCULAR | Status: DC | PRN
  Administered 2023-12-07: 30 mg via INTRAVENOUS
  Administered 2023-12-07 (×2): 10 mg via INTRAVENOUS

## 2023-12-07 MED ORDER — ACETAMINOPHEN 10 MG/ML IV SOLN: 1000.0000 mg | Freq: Once | INTRAVENOUS | Status: DC | PRN

## 2023-12-07 MED ORDER — KETAMINE HCL 50 MG/5ML IJ SOSY
PREFILLED_SYRINGE | INTRAMUSCULAR | Status: AC
  Filled 2023-12-07: qty 5

## 2023-12-07 MED ORDER — MIDAZOLAM HCL 5 MG/5ML IJ SOLN
INTRAMUSCULAR | Status: DC | PRN
  Administered 2023-12-07: 2 mg via INTRAVENOUS

## 2023-12-07 MED ORDER — ROCURONIUM BROMIDE 100 MG/10ML IV SOLN
INTRAVENOUS | Status: DC | PRN
  Administered 2023-12-07: 20 mg via INTRAVENOUS
  Administered 2023-12-07: 10 mg via INTRAVENOUS
  Administered 2023-12-07 (×2): 20 mg via INTRAVENOUS
  Administered 2023-12-07: 50 mg via INTRAVENOUS

## 2023-12-07 MED ORDER — ONDANSETRON HCL 4 MG/2ML IJ SOLN
INTRAMUSCULAR | Status: DC | PRN
  Administered 2023-12-07: 4 mg via INTRAVENOUS

## 2023-12-07 SURGICAL SUPPLY — 45 items
APPLICATOR VISTASEAL 35 (MISCELLANEOUS) IMPLANT
BAG PRESSURE INF REUSE 1000 (BAG) IMPLANT
COVER TIP SHEARS 8 DVNC (MISCELLANEOUS) ×1 IMPLANT
COVER WAND RF STERILE (DRAPES) ×1 IMPLANT
DERMABOND ADVANCED .7 DNX12 (GAUZE/BANDAGES/DRESSINGS) ×1 IMPLANT
DRAPE ARM DVNC X/XI (DISPOSABLE) ×3 IMPLANT
DRAPE COLUMN DVNC XI (DISPOSABLE) ×1 IMPLANT
ELECT REM PT RETURN 9FT ADLT (ELECTROSURGICAL) ×1
ELECTRODE REM PT RTRN 9FT ADLT (ELECTROSURGICAL) ×1 IMPLANT
FORCEPS BPLR R/ABLATION 8 DVNC (INSTRUMENTS) ×1 IMPLANT
GLOVE BIO SURGEON STRL SZ 6.5 (GLOVE) ×2 IMPLANT
GLOVE BIOGEL PI IND STRL 6.5 (GLOVE) ×2 IMPLANT
GOWN STRL REUS W/ TWL LRG LVL3 (GOWN DISPOSABLE) ×3 IMPLANT
IRRIGATOR SUCT 8 DISP DVNC XI (IRRIGATION / IRRIGATOR) IMPLANT
IV CATH ANGIO 12GX3 LT BLUE (NEEDLE) IMPLANT
IV NS 1000ML BAXH (IV SOLUTION) IMPLANT
KIT PINK PAD W/HEAD ARE REST (MISCELLANEOUS) ×1
KIT PINK PAD W/HEAD ARM REST (MISCELLANEOUS) ×1 IMPLANT
LABEL OR SOLS (LABEL) IMPLANT
MANIFOLD NEPTUNE II (INSTRUMENTS) ×1 IMPLANT
MESH 3DMAX MID 5X7 LT XLRG (Mesh General) IMPLANT
MESH 3DMAX MID 5X7 RT XLRG (Mesh General) IMPLANT
NDL DRIVE SUT CUT DVNC (INSTRUMENTS) ×1 IMPLANT
NDL HYPO 22X1.5 SAFETY MO (MISCELLANEOUS) ×1 IMPLANT
NDL INSUFFLATION 14GA 120MM (NEEDLE) ×1 IMPLANT
NEEDLE DRIVE SUT CUT DVNC (INSTRUMENTS) ×1
NEEDLE HYPO 22X1.5 SAFETY MO (MISCELLANEOUS) ×1
NEEDLE INSUFFLATION 14GA 120MM (NEEDLE) ×1
NS IRRIG 500ML POUR BTL (IV SOLUTION) IMPLANT
OBTURATOR OPTICAL STND 8 DVNC (TROCAR) ×1
OBTURATOR OPTICALSTD 8 DVNC (TROCAR) ×1 IMPLANT
PACK LAP CHOLECYSTECTOMY (MISCELLANEOUS) ×1 IMPLANT
SCISSORS MNPLR CVD DVNC XI (INSTRUMENTS) ×1 IMPLANT
SEAL UNIV 5-12 XI (MISCELLANEOUS) ×3 IMPLANT
SET TUBE SMOKE EVAC HIGH FLOW (TUBING) ×1 IMPLANT
SOL ELECTROSURG ANTI STICK (MISCELLANEOUS) ×1
SOLUTION ELECTROSURG ANTI STCK (MISCELLANEOUS) ×1 IMPLANT
SUT MNCRL 4-0 27XMFL (SUTURE) ×1
SUT STRATA 2-0 23CM CT-2 (SUTURE) ×1 IMPLANT
SUT VIC AB 2-0 SH 27XBRD (SUTURE) ×1 IMPLANT
SUTURE MNCRL 4-0 27XMF (SUTURE) ×1 IMPLANT
TAPE TRANSPORE STRL 2 31045 (GAUZE/BANDAGES/DRESSINGS) IMPLANT
TRAP FLUID SMOKE EVACUATOR (MISCELLANEOUS) ×1 IMPLANT
TRAY FOLEY MTR SLVR 16FR STAT (SET/KITS/TRAYS/PACK) ×1 IMPLANT
WATER STERILE IRR 500ML POUR (IV SOLUTION) ×1 IMPLANT

## 2023-12-07 NOTE — Transfer of Care (Signed)
 Immediate Anesthesia Transfer of Care Note  Patient: Alejandro Wall  Procedure(s) Performed: XI ROBOTIC ASSISTED BILATERAL INGUINAL HERNIA (Bilateral: Inguinal)  Patient Location: PACU  Anesthesia Type:General  Level of Consciousness: drowsy  Airway & Oxygen Therapy: Patient Spontanous Breathing  Post-op Assessment: Report given to RN and Post -op Vital signs reviewed and stable  Post vital signs: Reviewed and stable  Last Vitals:  Vitals Value Taken Time  BP 131/76 12/07/23 1000  Temp    Pulse 64 12/07/23 1002  Resp 19 12/07/23 1002  SpO2 93 % 12/07/23 1002  Vitals shown include unfiled device data.  Last Pain:  Vitals:   12/07/23 9367  TempSrc: Temporal  PainSc: 0-No pain         Complications: No notable events documented.

## 2023-12-07 NOTE — Op Note (Signed)
 Preoperative diagnosis: Right inguinal hernia                                           Recurrent left inguinal hernia  Postoperative diagnosis: Same  Procedure: Robotic assisted Laparoscopic Transabdominal preperitoneal laparoscopic (TAPP) repair of right non recurrent and left recurrent inguinal hernia.  Anesthesia: GETA  Surgeon: Dr. Cesar Coe  Wound Classification: Clean  Indications:  Patient is a 68 y.o. male developed a symptomatic right inguinal hernia and recurrent left inguinal hernia. Repair was indicated.  Findings: 1. Right indirect Inguinal hernia identified 2. Recurrent left femoral inguinal hernia identified.  3. Vas deferens and cord structures identified and preserved 4. Bard Extra Large 3D Max MID Anatomical mesh used for repair 5. Adequate hemostasis.   Description of procedure:  The patient was taken to the operating room and the correct side of surgery was verified. The patient was placed supine with right arm tucked at the side. After obtaining adequate anesthesia, the patient's abdomen was prepped and draped in standard sterile fashion. A time-out was completed verifying correct patient, procedure, site, positioning, and implant(s) and/or special equipment prior to beginning this procedure.  An incision was made in a natural skin line above the umbilicus. The fascia was elevated and the Veress needle inserted. Proper position was confirmed by aspiration and saline meniscus test.  The abdomen was insufflated with carbon dioxide to a pressure of 15 mmHg. The patient tolerated insufflation well.  Abdominal cavity was entered using Optiview technique with a millimeter trocar.  No injury was identified.  Another 2 mm trocars were placed lateral to each rectus muscle.  Scissors and bipolar forceps were inserted under direct visualization. Both hernias identified using the same technique as follow: At the robotic console: Transverse peritoneal incision is made about 8 cm  superior to the inguinal defect. Medial to the epigastric vessels, the parietal compartment is dissected to visualize the rectus muscle. This is carried down to the symphysis pubis and the retropubic space is dissected to expose at least 2 cm contralateral to the midline. Cooper's ligament is exposed and cleared at least 2 cm below the ligament to ensure adequate space for the inferior border of the mesh. Hesselbach's triangle is cleared assessing for a direct hernia. On the left side, herniated femoral contents, were reduced as well. Lateral to the epigastric vessels, the dissection is carried out in visceral compartment continuing in the true preperitoneal plane. No indirect inguinal hernia identified on the left groin. On the right side, Indirect hernia sac, was carefully reduced and separated from the cord structures with medial retraction and a combination of blunt/sharp dissection and focused cautery. This dissection was continued until the cord structures are "parietalized" completely, allowing for visualization of the reflected peritoneum that is continuous with the line originating 2 cm below Coopers medially and across the psoas muscle in the lateral compartment.  The internal ring was interrogated for a cord lipoma. The cord lipoma was reduced to the retroperitoneum and seated dorsal to the preperitoneal mesh. Having achieved a complete dissection with a critical view of the entire myopectineal orifice of both sides, a Right and Left XL mesh was then positioned centered at the iliopubic tract with the medial side crossing the midline and the inferior edge positioned 2 cm below Coopers ligament. The lateral aspect of the mesh extended 3-5 cm beyond the lateral edge of  the psoas. The mesh is fixated using an interrupted suture placed to the ipsilateral Coopers ligament. A second suture was done at the medial superior aspect of the mesh fixating this to the rectus complex.  The peritoneal flap is closed  with running barbed suture. Additional holes in the peritoneum closed with suture. Preperitoneal space gas aspirated to visualize the peritoneum apposed directly against the mesh and ensure no folding, lifting, or buckling of the mesh. Skin is closed, sterile dressings are applied.  The patient tolerated the procedure well and was taken to the postanesthesia care unit in stable condition  Specimen: None  Complications: None  Estimated Blood Loss: 10 mL

## 2023-12-07 NOTE — Anesthesia Preprocedure Evaluation (Addendum)
 Anesthesia Evaluation  Patient identified by MRN, date of birth, ID band Patient awake    Reviewed: Allergy & Precautions, NPO status , Patient's Chart, lab work & pertinent test results  History of Anesthesia Complications Negative for: history of anesthetic complications  Airway Mallampati: IV   Neck ROM: Full    Dental  (+) Missing   Pulmonary Current Smoker (occasional cigar) and Patient abstained from smoking.   Pulmonary exam normal breath sounds clear to auscultation       Cardiovascular hypertension, Normal cardiovascular exam Rhythm:Regular Rate:Normal  ECG 12/20/22:  Sinus tachycardia Left axis deviation Minimal voltage criteria for LVH, may be normal variant ( R in aVL ) Possible Anterior infarct , age undetermined  Echo 07/22/19:  Normal Stress Echocardiogram  NORMAL RIGHT VENTRICULAR SYSTOLIC FUNCTION  MILD VALVULAR REGURGITATION  NO VALVULAR STENOSIS NOTED    Neuro/Psych  PSYCHIATRIC DISORDERS Anxiety Depression    CVA (2016), No Residual Symptoms    GI/Hepatic ,GERD  ,,  Endo/Other  negative endocrine ROS    Renal/GU negative Renal ROS     Musculoskeletal  (+) Arthritis ,    Abdominal   Peds  Hematology negative hematology ROS (+)   Anesthesia Other Findings   Reproductive/Obstetrics                             Anesthesia Physical Anesthesia Plan  ASA: 3  Anesthesia Plan: General   Post-op Pain Management:    Induction: Intravenous  PONV Risk Score and Plan: 2 and Ondansetron , Dexamethasone  and Treatment may vary due to age or medical condition  Airway Management Planned: Oral ETT  Additional Equipment:   Intra-op Plan:   Post-operative Plan: Extubation in OR  Informed Consent: I have reviewed the patients History and Physical, chart, labs and discussed the procedure including the risks, benefits and alternatives for the proposed anesthesia with the  patient or authorized representative who has indicated his/her understanding and acceptance.   Patient has DNR.  Discussed DNR with patient and Suspend DNR.   Dental advisory given  Plan Discussed with: CRNA  Anesthesia Plan Comments: (Patient consented for risks of anesthesia including but not limited to:  - adverse reactions to medications - damage to eyes, teeth, lips or other oral mucosa - nerve damage due to positioning  - sore throat or hoarseness - damage to heart, brain, nerves, lungs, other parts of body or loss of life  Informed patient about role of CRNA in peri- and intra-operative care.  Patient voiced understanding.)        Anesthesia Quick Evaluation

## 2023-12-07 NOTE — Interval H&P Note (Signed)
 History and Physical Interval Note:  12/07/2023 7:11 AM  Alejandro Wall  has presented today for surgery, with the diagnosis of K40.21 bilateral recurrent inguinal hernia w/o obstruction or gangrene.  The various methods of treatment have been discussed with the patient and family. After consideration of risks, benefits and other options for treatment, the patient has consented to  Procedure(s): XI ROBOTIC ASSISTED BILATERAL INGUINAL HERNIA (Bilateral) as a surgical intervention.  The patient's history has been reviewed, patient examined, no change in status, stable for surgery.  I have reviewed the patient's chart and labs.  Questions were answered to the patient's satisfaction.     Lucas Sjogren

## 2023-12-07 NOTE — Anesthesia Procedure Notes (Addendum)
 Procedure Name: Intubation Date/Time: 12/07/2023 7:32 AM  Performed by: Germaine Maeola CROME, CRNAPre-anesthesia Checklist: Patient identified, Emergency Drugs available, Suction available and Patient being monitored Patient Re-evaluated:Patient Re-evaluated prior to induction Oxygen Delivery Method: Circle system utilized Preoxygenation: Pre-oxygenation with 100% oxygen Induction Type: IV induction Ventilation: Mask ventilation without difficulty Laryngoscope Size: McGrath and 4 Grade View: Grade I Tube type: Oral Number of attempts: 1 Airway Equipment and Method: Stylet Placement Confirmation: ETT inserted through vocal cords under direct vision, positive ETCO2 and breath sounds checked- equal and bilateral Secured at: 22 cm Tube secured with: Tape Dental Injury: Teeth and Oropharynx as per pre-operative assessment

## 2023-12-07 NOTE — Discharge Instructions (Addendum)

## 2023-12-07 NOTE — Anesthesia Postprocedure Evaluation (Signed)
 Anesthesia Post Note  Patient: Alejandro Wall  Procedure(s) Performed: XI ROBOTIC ASSISTED BILATERAL INGUINAL HERNIA (Bilateral: Inguinal)  Patient location during evaluation: PACU Anesthesia Type: General Level of consciousness: awake and alert, oriented and patient cooperative Pain management: pain level controlled Vital Signs Assessment: post-procedure vital signs reviewed and stable Respiratory status: spontaneous breathing, nonlabored ventilation and respiratory function stable Cardiovascular status: blood pressure returned to baseline and stable Postop Assessment: adequate PO intake Anesthetic complications: no   No notable events documented.   Last Vitals:  Vitals:   12/07/23 1100 12/07/23 1119  BP: (!) 144/84 (!) 142/83  Pulse: (!) 58 (!) 56  Resp: 15 18  Temp:  (!) 36.4 C  SpO2: 93% 95%    Last Pain:  Vitals:   12/07/23 1119  TempSrc: Temporal  PainSc: 2                  Kramer Hanrahan

## 2023-12-10 ENCOUNTER — Encounter: Payer: Self-pay | Admitting: General Surgery
